# Patient Record
Sex: Female | Born: 1965 | Hispanic: No | Marital: Married | Smoking: Never smoker
Health system: Southern US, Community
[De-identification: ages and names within clinical notes are randomized; demographics above are authoritative.]

## PROBLEM LIST (undated history)

## (undated) DIAGNOSIS — K5792 Diverticulitis of intestine, part unspecified, without perforation or abscess without bleeding: Secondary | ICD-10-CM

## (undated) DIAGNOSIS — L659 Nonscarring hair loss, unspecified: Secondary | ICD-10-CM

## (undated) DIAGNOSIS — R232 Flushing: Secondary | ICD-10-CM

## (undated) HISTORY — DX: Nonscarring hair loss, unspecified: L65.9

## (undated) HISTORY — DX: Flushing: R23.2

## (undated) HISTORY — PX: ABDOMINAL HYSTERECTOMY: SHX81

## (undated) HISTORY — PX: HERNIA REPAIR: SHX51

---

## 1999-07-29 ENCOUNTER — Other Ambulatory Visit: Admission: RE | Admit: 1999-07-29 | Discharge: 1999-07-29 | Payer: Self-pay | Admitting: Obstetrics and Gynecology

## 2001-03-29 ENCOUNTER — Other Ambulatory Visit: Admission: RE | Admit: 2001-03-29 | Discharge: 2001-03-29 | Payer: Self-pay | Admitting: *Deleted

## 2001-04-06 ENCOUNTER — Encounter: Admission: RE | Admit: 2001-04-06 | Discharge: 2001-04-06 | Payer: Self-pay | Admitting: Internal Medicine

## 2001-04-06 ENCOUNTER — Encounter: Payer: Self-pay | Admitting: Internal Medicine

## 2002-05-02 ENCOUNTER — Other Ambulatory Visit: Admission: RE | Admit: 2002-05-02 | Discharge: 2002-05-02 | Payer: Self-pay | Admitting: Internal Medicine

## 2002-08-20 ENCOUNTER — Encounter: Payer: Self-pay | Admitting: Internal Medicine

## 2002-08-20 ENCOUNTER — Encounter: Admission: RE | Admit: 2002-08-20 | Discharge: 2002-08-20 | Payer: Self-pay | Admitting: Internal Medicine

## 2003-04-03 ENCOUNTER — Other Ambulatory Visit: Admission: RE | Admit: 2003-04-03 | Discharge: 2003-04-03 | Payer: Self-pay | Admitting: Internal Medicine

## 2013-02-03 DIAGNOSIS — T7840XA Allergy, unspecified, initial encounter: Secondary | ICD-10-CM | POA: Insufficient documentation

## 2013-09-07 ENCOUNTER — Ambulatory Visit (INDEPENDENT_AMBULATORY_CARE_PROVIDER_SITE_OTHER): Payer: Self-pay

## 2013-09-07 ENCOUNTER — Other Ambulatory Visit: Payer: Self-pay | Admitting: Family Medicine

## 2013-09-07 DIAGNOSIS — M79609 Pain in unspecified limb: Secondary | ICD-10-CM

## 2013-09-07 DIAGNOSIS — R52 Pain, unspecified: Secondary | ICD-10-CM

## 2014-10-09 DIAGNOSIS — N951 Menopausal and female climacteric states: Secondary | ICD-10-CM | POA: Insufficient documentation

## 2014-10-09 DIAGNOSIS — Z0000 Encounter for general adult medical examination without abnormal findings: Secondary | ICD-10-CM | POA: Insufficient documentation

## 2015-06-17 ENCOUNTER — Emergency Department (HOSPITAL_BASED_OUTPATIENT_CLINIC_OR_DEPARTMENT_OTHER)
Admission: EM | Admit: 2015-06-17 | Discharge: 2015-06-17 | Disposition: A | Discharge status: Home / Self Care | Payer: Commercial Managed Care - HMO | Attending: Emergency Medicine | Admitting: Emergency Medicine

## 2015-06-17 ENCOUNTER — Encounter (HOSPITAL_BASED_OUTPATIENT_CLINIC_OR_DEPARTMENT_OTHER): Payer: Self-pay

## 2015-06-17 DIAGNOSIS — K5732 Diverticulitis of large intestine without perforation or abscess without bleeding: Secondary | ICD-10-CM | POA: Diagnosis not present

## 2015-06-17 DIAGNOSIS — R1032 Left lower quadrant pain: Secondary | ICD-10-CM | POA: Diagnosis present

## 2015-06-17 MED ORDER — METRONIDAZOLE IN NACL 5-0.79 MG/ML-% IV SOLN
500.0000 mg | Freq: Once | INTRAVENOUS | Status: AC
  Administered 2015-06-17: 500 mg via INTRAVENOUS
  Filled 2015-06-17: qty 100

## 2015-06-17 MED ORDER — HYDROCODONE-ACETAMINOPHEN 5-325 MG PO TABS: 1.0000 | ORAL_TABLET | ORAL | Status: DC | PRN

## 2015-06-17 MED ORDER — SODIUM CHLORIDE 0.9 % IV BOLUS (SEPSIS)
1000.0000 mL | Freq: Once | INTRAVENOUS | Status: AC
  Administered 2015-06-17: 1000 mL via INTRAVENOUS

## 2015-06-17 MED ORDER — ONDANSETRON 4 MG PO TBDP: ORAL_TABLET | ORAL | Status: DC

## 2015-06-17 MED ORDER — CIPROFLOXACIN HCL 500 MG PO TABS: 500.0000 mg | ORAL_TABLET | Freq: Two times a day (BID) | ORAL | Status: DC

## 2015-06-17 MED ORDER — METRONIDAZOLE 500 MG PO TABS: 500.0000 mg | ORAL_TABLET | Freq: Two times a day (BID) | ORAL | Status: DC

## 2015-06-17 MED ORDER — MORPHINE SULFATE (PF) 4 MG/ML IV SOLN
4.0000 mg | Freq: Once | INTRAVENOUS | Status: AC
  Administered 2015-06-17: 4 mg via INTRAVENOUS
  Filled 2015-06-17: qty 1

## 2015-06-17 MED ORDER — HYDROMORPHONE HCL 1 MG/ML IJ SOLN
1.0000 mg | Freq: Once | INTRAMUSCULAR | Status: AC
  Administered 2015-06-17: 1 mg via INTRAVENOUS
  Filled 2015-06-17: qty 1

## 2015-06-17 MED ORDER — CIPROFLOXACIN IN D5W 400 MG/200ML IV SOLN
400.0000 mg | Freq: Once | INTRAVENOUS | Status: AC
  Administered 2015-06-17: 400 mg via INTRAVENOUS
  Filled 2015-06-17: qty 200

## 2015-06-17 NOTE — Discharge Instructions (Signed)
Take Vicodin for severe pain only. No driving or operating heavy machinery while taking vicodin. This medication may cause drowsiness. Take Zofran as directed as needed for nausea. Take both antibiotics to completion. Follow up with your primary care doctor.  Diverticulitis Diverticulitis is inflammation or infection of small pouches in your colon that form when you have a condition called diverticulosis. The pouches in your colon are called diverticula. Your colon, or large intestine, is where water is absorbed and stool is formed. Complications of diverticulitis can include:  Bleeding.  Severe infection.  Severe pain.  Perforation of your colon.  Obstruction of your colon. CAUSES  Diverticulitis is caused by bacteria. Diverticulitis happens when stool becomes trapped in diverticula. This allows bacteria to grow in the diverticula, which can lead to inflammation and infection. RISK FACTORS People with diverticulosis are at risk for diverticulitis. Eating a diet that does not include enough fiber from fruits and vegetables may make diverticulitis more likely to develop. SYMPTOMS  Symptoms of diverticulitis may include:  Abdominal pain and tenderness. The pain is normally located on the left side of the abdomen, but may occur in other areas.  Fever and chills.  Bloating.  Cramping.  Nausea.  Vomiting.  Constipation.  Diarrhea.  Blood in your stool. DIAGNOSIS  Your health care provider will ask you about your medical history and do a physical exam. You may need to have tests done because many medical conditions can cause the same symptoms as diverticulitis. Tests may include:  Blood tests.  Urine tests.  Imaging tests of the abdomen, including X-rays and CT scans. When your condition is under control, your health care provider may recommend that you have a colonoscopy. A colonoscopy can show how severe your diverticula are and whether something else is causing your  symptoms. TREATMENT  Most cases of diverticulitis are mild and can be treated at home. Treatment may include:  Taking over-the-counter pain medicines.  Following a clear liquid diet.  Taking antibiotic medicines by mouth for 7-10 days. More severe cases may be treated at a hospital. Treatment may include:  Not eating or drinking.  Taking prescription pain medicine.  Receiving antibiotic medicines through an IV tube.  Receiving fluids and nutrition through an IV tube.  Surgery. HOME CARE INSTRUCTIONS   Follow your health care provider's instructions carefully.  Follow a full liquid diet or other diet as directed by your health care provider. After your symptoms improve, your health care provider may tell you to change your diet. He or she may recommend you eat a high-fiber diet. Fruits and vegetables are good sources of fiber. Fiber makes it easier to pass stool.  Take fiber supplements or probiotics as directed by your health care provider.  Only take medicines as directed by your health care provider.  Keep all your follow-up appointments. SEEK MEDICAL CARE IF:   Your pain does not improve.  You have a hard time eating food.  Your bowel movements do not return to normal. SEEK IMMEDIATE MEDICAL CARE IF:   Your pain becomes worse.  Your symptoms do not get better.  Your symptoms suddenly get worse.  You have a fever.  You have repeated vomiting.  You have bloody or black, tarry stools. MAKE SURE YOU:   Understand these instructions.  Will watch your condition.  Will get help right away if you are not doing well or get worse.   This information is not intended to replace advice given to you by your health care  provider. Make sure you discuss any questions you have with your health care provider.   Document Released: 05/26/2005 Document Revised: 08/21/2013 Document Reviewed: 07/11/2013 Elsevier Interactive Patient Education [employer]

## 2015-06-17 NOTE — ED Provider Notes (Signed)
CSN: [id number]     Arrival date & time 06/17/15  1340 History   First MD Initiated Contact with Patient 06/17/15 1350     Chief Complaint  Patient presents with  . Abdominal Pain     (Consider location/radiation/quality/duration/timing/severity/associated sxs/prior Treatment) HPI Comments: 49 y/o healthy F presenting with known diverticulitis. She was seen at [facility] for left lower quadrant abdominal pain, had a CT that confirmed diverticulitis without abscess or perforation. She was advised to go to the emergency department for pain control and antibiotics. She was not given anything for pain I her to arrival. Pain began yesterday around 3 PM and has been gradually worsening. Had an episode of nonbloody diarrhea after drinking the oral contrast. No fever, chills, nausea or vomiting.  Patient is a 49 y.o. female presenting with abdominal pain. The history is provided by the patient and medical records.  Abdominal Pain Pain location:  LLQ Pain quality: aching and sharp   Pain radiates to:  Back Pain severity now: 8/10 with movement and palpation. Onset quality:  Gradual Duration:  2 days Timing:  Constant Progression:  Worsening Chronicity:  New Relieved by:  Heat Worsened by:  Movement and palpation Associated symptoms: diarrhea (1 episode after drinking contrast)   Associated symptoms: no chills, no fever, no nausea and no vomiting     History reviewed. No pertinent past medical history. Past Surgical History  Procedure Laterality Date  . Abdominal hysterectomy     No family history on file. Social History  Substance Use Topics  . Smoking status: Never Smoker   . Smokeless tobacco: None  . Alcohol Use: No   OB History    No data available     Review of Systems  Constitutional: Negative for fever and chills.  Gastrointestinal: Positive for abdominal pain and diarrhea (1 episode after drinking contrast). Negative for nausea and vomiting.  All other  systems reviewed and are negative.     Allergies  Review of patient's allergies indicates no known allergies.  Home Medications   Prior to Admission medications   Medication Sig Start Date End Date Taking? Authorizing Provider  ciprofloxacin (CIPRO) 500 MG tablet Take 1 tablet (500 mg total) by mouth 2 (two) times daily. One po bid x 10 days 06/17/15   Kathrynn Speed, PA-C  HYDROcodone-acetaminophen (NORCO/VICODIN) 5-325 MG tablet Take 1-2 tablets by mouth every 4 (four) hours as needed. 06/17/15   Tae Robak M Skylene Deremer, PA-C  metroNIDAZOLE (FLAGYL) 500 MG tablet Take 1 tablet (500 mg total) by mouth 2 (two) times daily. One po bid x 10 days 06/17/15   Kathrynn Speed, PA-C  ondansetron (ZOFRAN ODT) 4 MG disintegrating tablet  ODT q4 hours prn nausea/vomit 06/17/15   Dann Galicia M Rafferty Postlewait, PA-C   BP 131/84 mmHg  Pulse 86  Temp(Src) 98.6 F (37 C) (Oral)  Resp 20  Ht  (1.575 m)  Wt 146 lb (66.225 kg)  BMI 26.70 kg/m2  SpO2 99% Physical Exam  Constitutional: She is oriented to person, place, and time. She appears well-developed and well-nourished. No distress.  HENT:  Head: Normocephalic and atraumatic.  Mouth/Throat: Oropharynx is clear and moist.  Eyes: Conjunctivae and EOM are normal. Pupils are equal, round, and reactive to light. No scleral icterus.  Neck: Normal range of motion. Neck supple.  Cardiovascular: Normal rate, regular rhythm and normal heart sounds.   Pulmonary/Chest: Effort normal and breath sounds normal. No respiratory distress.  Abdominal: Soft. Normal appearance and bowel sounds  are normal. There is tenderness in the left lower quadrant. There is guarding. There is no rigidity and no rebound.  No peritoneal signs.  Musculoskeletal: Normal range of motion. She exhibits no edema.  Neurological: She is alert and oriented to person, place, and time. No sensory deficit.  Skin: Skin is warm and dry.  Psychiatric: She has a normal mood and affect. Her behavior is normal.   Nursing note and vitals reviewed.   ED Course  Procedures (including critical care time) Labs Review Labs Reviewed - No data to display  Imaging Review No results found. I have personally reviewed and evaluated these images and lab results as part of my medical decision-making.   EKG Interpretation None      [facility]. WBC 9.7, hgb 12.3. Electrolytes WNL. CT [facility] abnormal significant thickening of colonic wall in distal left colon left abdomen. There is pericolonic stranding and small amount of pericolonic fluid. Findings consistent with significant segmental colitis or acute diverticulitis. No pericolonic abscess or definite evidence of perforation.  MDM   Final diagnoses:  Diverticulitis of large intestine without perforation or abscess without bleeding   Non-toxic/non-septic appearing, NAD. AFVSS. Pain improved in ED with morphine and dilaudid. Resting comfortably. Repeat exam without guarding. Given flagyl/cipro IV. Pt feeling well enough to go home. Will d/c home with cipro/flagyl, zofran and vicodin. Advised PCP f/u within 2 days. Infection care/precautions discussed. Stable for d/c. Return precautions given. Pt/family/caregiver aware medical decision making process and agreeable with plan.  Discussed with Dr. Anitra LauthPlunkett, agrees with plan.   Kathrynn SpeedRobyn M Nya Monds, PA-C 06/17/15 1624  Gwyneth SproutWhitney Plunkett, MD 06/18/15 1530

## 2015-06-17 NOTE — ED Notes (Addendum)
Left side abd pain-pt from outpt CT-MD called with results and advised pt to come to ED

## 2015-06-30 DIAGNOSIS — K5732 Diverticulitis of large intestine without perforation or abscess without bleeding: Secondary | ICD-10-CM | POA: Insufficient documentation

## 2016-01-21 DIAGNOSIS — G4700 Insomnia, unspecified: Secondary | ICD-10-CM | POA: Insufficient documentation

## 2016-05-27 ENCOUNTER — Ambulatory Visit: Payer: Commercial Managed Care - HMO | Admitting: Medical

## 2016-05-31 ENCOUNTER — Ambulatory Visit: Payer: Commercial Managed Care - HMO | Admitting: Family Medicine

## 2017-02-01 DIAGNOSIS — G43009 Migraine without aura, not intractable, without status migrainosus: Secondary | ICD-10-CM | POA: Insufficient documentation

## 2019-03-31 ENCOUNTER — Emergency Department (HOSPITAL_BASED_OUTPATIENT_CLINIC_OR_DEPARTMENT_OTHER): Payer: 59

## 2019-03-31 ENCOUNTER — Other Ambulatory Visit: Payer: Self-pay

## 2019-03-31 ENCOUNTER — Encounter (HOSPITAL_BASED_OUTPATIENT_CLINIC_OR_DEPARTMENT_OTHER): Payer: Self-pay | Admitting: Emergency Medicine

## 2019-03-31 ENCOUNTER — Emergency Department (HOSPITAL_BASED_OUTPATIENT_CLINIC_OR_DEPARTMENT_OTHER)
Admission: EM | Admit: 2019-03-31 | Discharge: 2019-03-31 | Disposition: A | Discharge status: Home / Self Care | Payer: 59 | Attending: Emergency Medicine | Admitting: Emergency Medicine

## 2019-03-31 DIAGNOSIS — R109 Unspecified abdominal pain: Secondary | ICD-10-CM | POA: Diagnosis present

## 2019-03-31 DIAGNOSIS — K529 Noninfective gastroenteritis and colitis, unspecified: Secondary | ICD-10-CM | POA: Insufficient documentation

## 2019-03-31 HISTORY — DX: Diverticulitis of intestine, part unspecified, without perforation or abscess without bleeding: K57.92

## 2019-03-31 LAB — COMPREHENSIVE METABOLIC PANEL
ALT: 21 U/L (ref 0–44)
AST: 25 U/L (ref 15–41)
Albumin: 3.7 g/dL (ref 3.5–5.0)
Alkaline Phosphatase: 86 U/L (ref 38–126)
Anion gap: 10 (ref 5–15)
BUN: 11 mg/dL (ref 6–20)
CO2: 26 mmol/L (ref 22–32)
Calcium: 8.8 mg/dL — ABNORMAL LOW (ref 8.9–10.3)
Chloride: 97 mmol/L — ABNORMAL LOW (ref 98–111)
Creatinine, Ser: 1.04 mg/dL — ABNORMAL HIGH (ref 0.44–1.00)
GFR calc Af Amer: >60 mL/min (ref >60)
GFR calc non Af Amer: >60 mL/min (ref >60)
Glucose, Bld: 107 mg/dL — ABNORMAL HIGH (ref 70–99)
Potassium: 4.1 mmol/L (ref 3.5–5.1)
Sodium: 133 mmol/L — ABNORMAL LOW (ref 135–145)
Total Bilirubin: 0.8 mg/dL (ref 0.3–1.2)
Total Protein: 7.4 g/dL (ref 6.5–8.1)

## 2019-03-31 LAB — URINALYSIS, ROUTINE W REFLEX MICROSCOPIC
Bilirubin Urine: NEGATIVE
Glucose, UA: NEGATIVE mg/dL
Ketones, ur: NEGATIVE mg/dL
Leukocytes,Ua: NEGATIVE
Nitrite: NEGATIVE
Protein, ur: NEGATIVE mg/dL
Specific Gravity, Urine: 1.02 (ref 1.005–1.030)
pH: 6 (ref 5.0–8.0)

## 2019-03-31 LAB — CBC WITH DIFFERENTIAL/PLATELET
Abs Immature Granulocytes: 0.02 10*3/uL (ref 0.00–0.07)
Basophils Absolute: 0 10*3/uL (ref 0.0–0.1)
Basophils Relative: 0 %
Eosinophils Absolute: 0 10*3/uL (ref 0.0–0.5)
Eosinophils Relative: 1 %
HCT: 39 % (ref 36.0–46.0)
Hemoglobin: 12.5 g/dL (ref 12.0–15.0)
Immature Granulocytes: 0 %
Lymphocytes Relative: 17 %
Lymphs Abs: 1.4 10*3/uL (ref 0.7–4.0)
MCH: 30.1 pg (ref 26.0–34.0)
MCHC: 32.1 g/dL (ref 30.0–36.0)
MCV: 94 fL (ref 80.0–100.0)
Monocytes Absolute: 0.8 10*3/uL (ref 0.1–1.0)
Monocytes Relative: 10 %
Neutro Abs: 5.7 10*3/uL (ref 1.7–7.7)
Neutrophils Relative %: 72 %
Platelets: 282 10*3/uL (ref 150–400)
RBC: 4.15 MIL/uL (ref 3.87–5.11)
RDW: 13.2 % (ref 11.5–15.5)
WBC: 8 10*3/uL (ref 4.0–10.5)
nRBC: 0 % (ref 0.0–0.2)

## 2019-03-31 LAB — URINALYSIS, MICROSCOPIC (REFLEX)

## 2019-03-31 LAB — LIPASE, BLOOD: Lipase: 24 U/L (ref 11–51)

## 2019-03-31 MED ORDER — METRONIDAZOLE 500 MG PO TABS
500.0000 mg | ORAL_TABLET | Freq: Once | ORAL | Status: AC
  Administered 2019-03-31: 500 mg via ORAL
  Filled 2019-03-31: qty 1

## 2019-03-31 MED ORDER — HYDROMORPHONE HCL 1 MG/ML IJ SOLN
1.0000 mg | Freq: Once | INTRAMUSCULAR | Status: AC
  Administered 2019-03-31: 1 mg via INTRAVENOUS
  Filled 2019-03-31: qty 1

## 2019-03-31 MED ORDER — METRONIDAZOLE IN NACL 5-0.79 MG/ML-% IV SOLN: 500.0000 mg | Freq: Once | INTRAVENOUS | Status: DC

## 2019-03-31 MED ORDER — HYDROCODONE-ACETAMINOPHEN 5-325 MG PO TABS: 1.0000 | ORAL_TABLET | ORAL | 0 refills | Status: DC | PRN

## 2019-03-31 MED ORDER — SODIUM CHLORIDE 0.9 % IV BOLUS
1000.0000 mL | Freq: Once | INTRAVENOUS | Status: AC
  Administered 2019-03-31: 1000 mL via INTRAVENOUS

## 2019-03-31 MED ORDER — MORPHINE SULFATE (PF) 4 MG/ML IV SOLN
4.0000 mg | Freq: Once | INTRAVENOUS | Status: AC
  Administered 2019-03-31: 4 mg via INTRAVENOUS
  Filled 2019-03-31: qty 1

## 2019-03-31 MED ORDER — IOHEXOL 300 MG/ML  SOLN
100.0000 mL | Freq: Once | INTRAMUSCULAR | Status: AC | PRN
  Administered 2019-03-31: 100 mL via INTRAVENOUS

## 2019-03-31 MED ORDER — CIPROFLOXACIN HCL 500 MG PO TABS: 500.0000 mg | ORAL_TABLET | Freq: Two times a day (BID) | ORAL | 0 refills | Status: DC

## 2019-03-31 MED ORDER — METRONIDAZOLE 500 MG PO TABS: 500.0000 mg | ORAL_TABLET | Freq: Three times a day (TID) | ORAL | 0 refills | Status: DC

## 2019-03-31 MED ORDER — CIPROFLOXACIN HCL 500 MG PO TABS
500.0000 mg | ORAL_TABLET | Freq: Once | ORAL | Status: AC
  Administered 2019-03-31: 500 mg via ORAL
  Filled 2019-03-31: qty 1

## 2019-03-31 MED ORDER — ONDANSETRON HCL 4 MG/2ML IJ SOLN
4.0000 mg | Freq: Once | INTRAMUSCULAR | Status: AC
  Administered 2019-03-31: 4 mg via INTRAVENOUS
  Filled 2019-03-31: qty 2

## 2019-03-31 MED ORDER — ONDANSETRON 4 MG PO TBDP: 4.0000 mg | ORAL_TABLET | Freq: Three times a day (TID) | ORAL | 0 refills | Status: DC | PRN

## 2019-03-31 NOTE — ED Notes (Signed)
ED Provider at bedside. 

## 2019-03-31 NOTE — ED Notes (Signed)
unsuccesful IV right AC.

## 2019-03-31 NOTE — ED Provider Notes (Signed)
[facility] EMERGENCY DEPARTMENT Provider Note   CSN: [id number] Arrival date & time: 03/31/19  1521     History   Chief Complaint Chief Complaint  Patient presents with  . Abdominal Pain    HPI Caitlyn ShuttersLeslie Maisano is a 53 y.o. female who presents with abdominal pain.  Past medical history significant for diverticulitis.  Patient states that yesterday she started to develop lower abdominal pain.  It is over the left lower quadrant and radiates to the right lower quadrant.  Pain feels like an intense pressure.  It is worse when she moves around and she cannot get comfortable.  She has not had an appetite.  Sometimes feels better when she tries to go to the bathroom however she has not been able to have a good bowel movement. She has had a small amount of non-bloody diarrhea. She denies fever, chills, nausea, vomiting.  She denies any urinary symptoms.  Past surgical history significant for hysterectomy and a hernia repair.  She reportedly had a colonoscopy in 2016 which was normal.     HPI  Past Medical History:  Diagnosis Date  . Diverticulitis     There are no active problems to display for this patient.   Past Surgical History:  Procedure Laterality Date  . ABDOMINAL HYSTERECTOMY    . HERNIA REPAIR       OB History   No obstetric history on file.      Home Medications    Prior to Admission medications   Medication Sig Start Date End Date Taking? Authorizing Provider  ciprofloxacin (CIPRO) 500 MG tablet Take 1 tablet (500 mg total) by mouth 2 (two) times daily. One po bid x 10 days 06/17/15   Hess, Nada Boozerobyn M, PA-C  HYDROcodone-acetaminophen (NORCO/VICODIN) 5-325 MG tablet Take 1-2 tablets by mouth every 4 (four) hours as needed. 06/17/15   Hess, Nada Boozerobyn M, PA-C  metroNIDAZOLE (FLAGYL) 500 MG tablet Take 1 tablet (500 mg total) by mouth 2 (two) times daily. One po bid x 10 days 06/17/15   Paulina FusiHess, Melina Schoolsobyn M, PA-C  ondansetron (ZOFRAN ODT) 4 MG disintegrating tablet 4mg  ODT  q4 hours prn nausea/vomit 06/17/15   Hess, Nada Boozerobyn M, PA-C    Family History No family history on file.  Social History Social History   Tobacco Use  . Smoking status: Never Smoker  . Smokeless tobacco: Never Used  Substance Use Topics  . Alcohol use: No  . Drug use: No     Allergies   Patient has no known allergies.   Review of Systems Review of Systems  Constitutional: Negative for chills and fever.  Respiratory: Negative for shortness of breath.   Cardiovascular: Negative for chest pain.  Gastrointestinal: Positive for abdominal pain and diarrhea. Negative for blood in stool, nausea and vomiting.  Genitourinary: Negative for difficulty urinating and dysuria.  All other systems reviewed and are negative.    Physical Exam Updated Vital Signs BP (!) 142/115 (BP Location: Left Arm)   Pulse 100   Temp 99.7 F (37.6 C) (Oral)   Resp 18   Ht 5\' 2"  (1.575 m)   Wt 65.8 kg   SpO2 98%   BMI 26.52 kg/m   Physical Exam Vitals signs and nursing note reviewed.  Constitutional:      General: She is not in acute distress.    Appearance: She is well-developed. She is not ill-appearing.     Comments: Calm, cooperative. Mildly uncomfortable appearing  HENT:     Head: Normocephalic  and atraumatic.  Eyes:     General: No scleral icterus.       Right eye: No discharge.        Left eye: No discharge.     Conjunctiva/sclera: Conjunctivae normal.     Pupils: Pupils are equal, round, and reactive to light.  Neck:     Musculoskeletal: Normal range of motion.  Cardiovascular:     Rate and Rhythm: Normal rate.  Pulmonary:     Effort: Pulmonary effort is normal. No respiratory distress.  Abdominal:     General: Abdomen is flat. Bowel sounds are normal. There is no distension.     Palpations: Abdomen is soft.     Tenderness: There is abdominal tenderness (worse in LLQ) in the right lower quadrant, suprapubic area and left upper quadrant.  Skin:    General: Skin is warm and  dry.  Neurological:     Mental Status: She is alert and oriented to person, place, and time.  Psychiatric:        Behavior: Behavior normal.      ED Treatments / Results  Labs (all labs ordered are listed, but only abnormal results are displayed) Labs Reviewed  COMPREHENSIVE METABOLIC PANEL - Abnormal; Notable for the following components:      Result Value   Sodium 133 (*)    Chloride 97 (*)    Glucose, Bld 107 (*)    Creatinine, Ser 1.04 (*)    Calcium 8.8 (*)    All other components within normal limits  URINALYSIS, ROUTINE W REFLEX MICROSCOPIC - Abnormal; Notable for the following components:   Hgb urine dipstick SMALL (*)    All other components within normal limits  URINALYSIS, MICROSCOPIC (REFLEX) - Abnormal; Notable for the following components:   Bacteria, UA FEW (*)    All other components within normal limits  LIPASE, BLOOD  CBC WITH DIFFERENTIAL/PLATELET    EKG None  Radiology Ct Abdomen Pelvis W Contrast  Result Date: 03/31/2019 CLINICAL DATA:  Abdominal pain and diarrhea EXAM: CT ABDOMEN AND PELVIS WITH CONTRAST TECHNIQUE: Multidetector CT imaging of the abdomen and pelvis was performed using the standard protocol following bolus administration of intravenous contrast. CONTRAST:  100mL OMNIPAQUE IOHEXOL 300 MG/ML  SOLN COMPARISON:  June 17, 2015. FINDINGS: Lower chest: On axial slice 1 series 3, there is a stable 3 mm nodular opacity in the lateral segment right middle lobe. Lung bases otherwise are clear. Hepatobiliary: No focal liver lesions are evident. There is a Riedel's lobe on the right, an anatomic variant. Gallbladder wall is not appreciably thickened. There is no biliary duct dilatation. Pancreas: There is no appreciable pancreatic mass or inflammatory focus. Spleen: No splenic lesions are evident. Adrenals/Urinary Tract: Adrenals bilaterally appear unremarkable. There is a 1 x 1 cm cyst in the upper pole left kidney. No hydronephrosis is evident on  either side. No renal or ureteral calculi are demonstrable. Contrast is seen within the collecting systems and ureters, a finding which could obscure small calculi. Urinary bladder is midline with wall thickness within normal limits. Stomach/Bowel: There is a loop of proximal sigmoid colon in the left mid pelvis which shows generalized wall thickening. This loop of bowel with wall thickening extends over approximately 6 cm. There is apparent luminal narrowing in this area. There is no surrounding mesenteric thickening. No diverticular irregularity seen in this area. No similar bowel wall thickening is seen elsewhere in the abdomen or pelvis. No evident bowel obstruction. No free air or portal venous  air. The terminal ileum appears unremarkable. Vascular/Lymphatic: There is no abdominal aortic aneurysm. No vascular lesions are evident. There is no adenopathy in the abdomen or pelvis. Reproductive: Uterus is absent. No adnexal masses are evident. There is moderate fluid in the dependent portion of the pelvis slightly to the right of midline. Other: Appendix appears normal. No abscess is evident in the abdomen or pelvis. Musculoskeletal: There are foci of degenerative change in the lumbar spine. There are no blastic or lytic bone lesions. There is no intramuscular or abdominal wall lesion. IMPRESSION: 1. There is an approximately 6 cm long focus of concentric narrowing of the proximal sigmoid colon. There is no appreciable diverticular disease in this area. This area may represent localized colitis. A concentric mass arising in this portion of the colon could present in this manner. This finding warrants direct visualization to assess for possible neoplastic lesion in the sigmoid colon. No other bowel wall thickening. No diverticulitis appreciable. No bowel obstruction. 2. Moderate free fluid is noted in the dependent portion of the pelvis. Question recent ovarian cyst rupture. 3.  No abscess in the abdomen or pelvis.   Appendix appears normal. 4. No evident renal or ureteral calculus. No hydronephrosis. Note that contrast is seen in the collecting systems and ureters which could obscure small calculi. Due to technical issue, earlier images were not able to be obtained showing renal parenchymal and ureters without contrast present. 5.  3 mm nodular opacity right middle lobe, stable. 6.  Uterus absent. Electronically Signed   By: Lowella Grip III M.D.   On: 03/31/2019 18:14    Procedures Procedures (including critical care time)  Medications Ordered in ED Medications  sodium chloride 0.9 % bolus 1,000 mL (0 mLs Intravenous Stopped 03/31/19 2026)  morphine 4 MG/ML injection 4 mg (4 mg Intravenous Given 03/31/19 1648)  iohexol (OMNIPAQUE) 300 MG/ML solution 100 mL (100 mLs Intravenous Contrast Given 03/31/19 1733)  HYDROmorphone (DILAUDID) injection 1 mg (1 mg Intravenous Given 03/31/19 1851)  ciprofloxacin (CIPRO) tablet 500 mg (500 mg Oral Given 03/31/19 1850)  metroNIDAZOLE (FLAGYL) tablet 500 mg (500 mg Oral Given 03/31/19 1850)  ondansetron (ZOFRAN) injection 4 mg (4 mg Intravenous Given 03/31/19 2023)     Initial Impression / Assessment and Plan / ED Course  I have reviewed the triage vital signs and the nursing notes.  Pertinent labs & imaging results that were available during my care of the patient were reviewed by me and considered in my medical decision making (see chart for details).  53 year old female presents with left lower quadrant pain for 1 day.  She has a history of diverticulitis.  She is initially hypertensive and mildly tachycardic.  Repeat vital signs are improved.  Lungs are clear to auscultation.  Abdomen is soft and tender in the left lower quadrant and suprapubic area.  Will obtain labs, UA, CT abdomen pelvis.  Will give pain medicine and fluids.  CBC is normal.  CMP is remarkable for mild hyponatremia and hypochloremia.  Calcium is slightly low as well.  Liver function is normal.  UA is  unremarkable.  CT shows focal colitis in the proximal sigmoid colon versus a possible mass.  Most likely symptoms are related to colitis due to acute onset.  Will treat with Cipro and Flagyl.  Recheck the patient she is still uncomfortable due to pain.  She was given additional dose of pain medicine and antibiotics here.  Informed by nursing that the patient is started to vomit.  I  rechecked the patient and she states that she has not had anything to eat all day and that in addition to the pain medicines made her throw up.  She feels better now.  We will give her a prescription for pain medicine, antibiotics, nausea medicine.  She was also given a referral to GI as she will likely need follow-up regarding CT findings.  Final Clinical Impressions(s) / ED Diagnoses   Final diagnoses:  Colitis    ED Discharge Orders    None       Bethel BornGekas, Abbigaile Rockman Marie, PA-C 03/31/19 2141    Pricilla LovelessGoldston, Scott, MD 04/01/19 2249

## 2019-03-31 NOTE — Discharge Instructions (Signed)
Take Cipro 500mg  twice a day for one week Take Flagyl 500mg  three times a day for one week Take Norco as needed for pain Take zofran as needed for nausea Please follow up with a GI doctor Return if worsening

## 2019-03-31 NOTE — ED Triage Notes (Signed)
LLQ pain since yesterday. Hx of diverticulitis.

## 2019-08-22 DIAGNOSIS — E7800 Pure hypercholesterolemia, unspecified: Secondary | ICD-10-CM | POA: Insufficient documentation

## 2019-08-22 DIAGNOSIS — D72819 Decreased white blood cell count, unspecified: Secondary | ICD-10-CM | POA: Insufficient documentation

## 2020-03-12 ENCOUNTER — Emergency Department (HOSPITAL_BASED_OUTPATIENT_CLINIC_OR_DEPARTMENT_OTHER)
Admission: EM | Admit: 2020-03-12 | Discharge: 2020-03-12 | Disposition: A | Discharge status: Home / Self Care | Payer: 59 | Attending: Emergency Medicine | Admitting: Emergency Medicine

## 2020-03-12 ENCOUNTER — Other Ambulatory Visit: Payer: Self-pay

## 2020-03-12 ENCOUNTER — Encounter (HOSPITAL_BASED_OUTPATIENT_CLINIC_OR_DEPARTMENT_OTHER): Payer: Self-pay

## 2020-03-12 DIAGNOSIS — J40 Bronchitis, not specified as acute or chronic: Secondary | ICD-10-CM | POA: Insufficient documentation

## 2020-03-12 DIAGNOSIS — R05 Cough: Secondary | ICD-10-CM | POA: Diagnosis present

## 2020-03-12 MED ORDER — AEROCHAMBER PLUS FLO-VU MEDIUM MISC
1.0000 | Freq: Once | Status: AC
  Administered 2020-03-12: 1
  Filled 2020-03-12: qty 1

## 2020-03-12 MED ORDER — BENZONATATE 100 MG PO CAPS: 100.0000 mg | ORAL_CAPSULE | Freq: Three times a day (TID) | ORAL | 0 refills | Status: DC

## 2020-03-12 MED ORDER — PREDNISONE 50 MG PO TABS: 50.0000 mg | ORAL_TABLET | Freq: Every day | ORAL | 0 refills | Status: AC

## 2020-03-12 MED ORDER — DICLOFENAC SODIUM 1 % EX GEL: 2.0000 g | Freq: Four times a day (QID) | CUTANEOUS | 0 refills | Status: DC

## 2020-03-12 MED ORDER — ALBUTEROL SULFATE HFA 108 (90 BASE) MCG/ACT IN AERS
2.0000 | INHALATION_SPRAY | Freq: Once | RESPIRATORY_TRACT | Status: AC
  Administered 2020-03-12: 2 via RESPIRATORY_TRACT
  Filled 2020-03-12: qty 6.7

## 2020-03-12 NOTE — Discharge Instructions (Signed)
Use your inhaler every 4 hours for the next 2 days.  After this, use as needed for shortness of breath, chest tightness, or coughing fits. Take the prednisone as prescribed.  Take the entire course. Use the voltaren gel on your chest to help with discomfort Use the Tessalon Perles to help decrease cough. You may use cough drops in between as needed for further coughing spells. You may try the cough syrup and/or honey to coat your throat and help with throat irritation. Over the primary care doctor as needed for symptoms not proving. Return to the emergency room with any new, worsening, concerning symptoms.

## 2020-03-12 NOTE — ED Triage Notes (Signed)
Pt c/o flu like sx started 7/5-seen at [facility] and "respiratory infection"-completed abx and prednisone-NAD-steady gait

## 2020-03-12 NOTE — ED Provider Notes (Signed)
[facility] EMERGENCY DEPARTMENT Provider Note   CSN: [id number] Arrival date & time: 03/12/20  1240     History Chief Complaint  Patient presents with   Cough    Caitlyn Sosa is a 54 y.o. female presenting for evaluation of cough, sore throat, chest tightness.  Patient states her symptoms began 10 days ago.  She was seen at urgent care, prescribed azithromycin and prednisone taper.  She states her symptoms were improving until yesterday, and they worsened again.  She finished her prednisone 2 days ago.  She states she tried exercise yesterday, but felt extremely short of breath and with lots of chest tightness.  Since then, she has been coughing a lot.  Is a nonproductive cough.  Denies fevers or chills.  She denies nasal congestion, ear pain, nausea, vomiting abdominal pain.  She reports mild throat irritation.  She denies history of asthma or COPD.  She has never needed an inhaler before.  She has received both Covid vaccines.  She denies sick contacts, or known contact with COVID-19 positive person.  She reports no other medical problems, takes no medications daily.  HPI     Past Medical History:  Diagnosis Date   Diverticulitis     There are no problems to display for this patient.   Past Surgical History:  Procedure Laterality Date   ABDOMINAL HYSTERECTOMY     HERNIA REPAIR       OB History   No obstetric history on file.     No family history on file.  Social History   Tobacco Use   Smoking status: Never Smoker   Smokeless tobacco: Never Used  Vaping Use   Vaping Use: Never used  Substance Use Topics   Alcohol use: No   Drug use: No    Home Medications Prior to Admission medications   Medication Sig Start Date End Date Taking? Authorizing Provider  benzonatate (TESSALON) 100 MG capsule Take 1 capsule (100 mg total) by mouth every 8 (eight) hours. 03/12/20   Mikiya Nebergall, PA-C  diclofenac Sodium (VOLTAREN) 1 % GEL Apply 2 g  topically 4 (four) times daily. 03/12/20   Dahir Ayer, PA-C  metroNIDAZOLE (FLAGYL) 500 MG tablet Take 1 tablet (500 mg total) by mouth 3 (three) times daily. 03/31/19   Bethel Born, PA-C  ondansetron (ZOFRAN ODT) 4 MG disintegrating tablet Take 1 tablet (4 mg total) by mouth every 8 (eight) hours as needed for nausea or vomiting. 03/31/19   Bethel Born, PA-C  predniSONE (DELTASONE) 50 MG tablet Take 1 tablet (50 mg total) by mouth daily for 5 days. 03/12/20 03/17/20  Whitfield Dulay, PA-C    Allergies    Patient has no known allergies.  Review of Systems   Review of Systems  HENT: Positive for sore throat.   Respiratory: Positive for cough and chest tightness.     Physical Exam Updated Vital Signs BP 133/90 (BP Location: Right Arm)    Pulse 80    Temp 97.9 F (36.6 C) (Oral)    Resp 16    Ht 5\' 2"  (1.575 m)    Wt 66.9 kg    SpO2 100%    BMI 26.98 kg/m   Physical Exam Vitals and nursing note reviewed.  Constitutional:      General: She is not in acute distress.    Appearance: She is well-developed.     Comments: Sitting in the chair no acute distress  HENT:     Head: Normocephalic  and atraumatic.     Comments: OP clear without tonsillar swelling exudate.  Uvula midline with equal palate rise.  TMs nonerythematous nonbulging bilaterally.  No significant nasal mucosal edema or rhinorrhea. Cardiovascular:     Rate and Rhythm: Normal rate and regular rhythm.     Pulses: Normal pulses.  Pulmonary:     Effort: Pulmonary effort is normal.     Breath sounds: Wheezing present.     Comments: Scattered expiratory wheeze heard on my exam.  Dry barky cough noted on exam.  Speaking full sentences.  Sats stable on room air.  No signs of accessory muscle use or respiratory distress. Abdominal:     General: There is no distension.     Palpations: There is no mass.     Tenderness: There is no abdominal tenderness. There is no guarding or rebound.  Musculoskeletal:         General: Normal range of motion.     Cervical back: Normal range of motion.  Skin:    General: Skin is warm.     Capillary Refill: Capillary refill takes less than 2 seconds.     Findings: No rash.  Neurological:     Mental Status: She is alert and oriented to person, place, and time.     ED Results / Procedures / Treatments   Labs (all labs ordered are listed, but only abnormal results are displayed) Labs Reviewed - No data to display  EKG None  Radiology No results found.  Procedures Procedures (including critical care time)  Medications Ordered in ED Medications  albuterol (VENTOLIN HFA) 108 (90 Base) MCG/ACT inhaler 2 puff (2 puffs Inhalation Given 03/12/20 1444)  AeroChamber Plus Flo-Vu Medium MISC 1 each (1 each Other Given 03/12/20 1447)    ED Course  I have reviewed the triage vital signs and the nursing notes.  Pertinent labs & imaging results that were available during my care of the patient were reviewed by me and considered in my medical decision making (see chart for details).    MDM Rules/Calculators/A&P                          Patient presenting for evaluation of continued cough, chest tightness, and sore throat.  This is been going on for approximately 10 days.  On my exam, she has some mild wheezing, as well as a barky cough.  Concern for bronchitis.  She has already been treated with antibiotics, low suspicion for pneumonia without fever or productive cough.  I do not believe chest x-ray would be beneficial, patient is agreeable.  Will give albuterol and reassess.  On reassessment, patient reports mild improvement of symptoms.  She is no longer coughing as much.  Wheezing has resolved on repeat exam.  Discussed treatment for bronchitis and typical course of symptoms lasting several weeks.  Discussed viral etiology, and that repeat antibiotic would not be beneficial.  Offered repeat prednisone burst treatment with NSAIDs, patient elects for steroids.   Discussed follow-up with PCP symptoms not improving.  At this time, patient appears safe for discharge.  Return precautions given.  Patient states she understands and agrees to plan.  Final Clinical Impression(s) / ED Diagnoses Final diagnoses:  Bronchitis    Rx / DC Orders ED Discharge Orders         Ordered    predniSONE (DELTASONE) 50 MG tablet  Daily     Discontinue  Reprint     03/12/20 1509  benzonatate (TESSALON) 100 MG capsule  Every 8 hours     Discontinue  Reprint     03/12/20 1509    diclofenac Sodium (VOLTAREN) 1 % GEL  4 times daily     Discontinue  Reprint     03/12/20 1509           Alveria Apley, PA-C 03/12/20 1645    Tegeler, Canary Brim, MD 03/14/20 1302

## 2020-04-23 IMAGING — CT CT ABDOMEN AND PELVIS WITH CONTRAST
2 of 5 series · 14 of 46 positions shown, 16 images · IV contrast (APPLIED)
Comparison: June 17, 2015.

CLINICAL DATA: Abdominal pain and diarrhea

EXAM:
CT ABDOMEN AND PELVIS WITH CONTRAST
TECHNIQUE: Multidetector CT imaging of the abdomen and pelvis was performed
using the standard protocol following bolus administration of
intravenous contrast.
CONTRAST:  100mL OMNIPAQUE IOHEXOL 300 MG/ML  SOLN

[Series 2: axial st · axial · 0.72mm/px · z∈[+478,+858]mm · 11 of 86 slices shown, 13 images]
[im 5/86  soft-tissue]
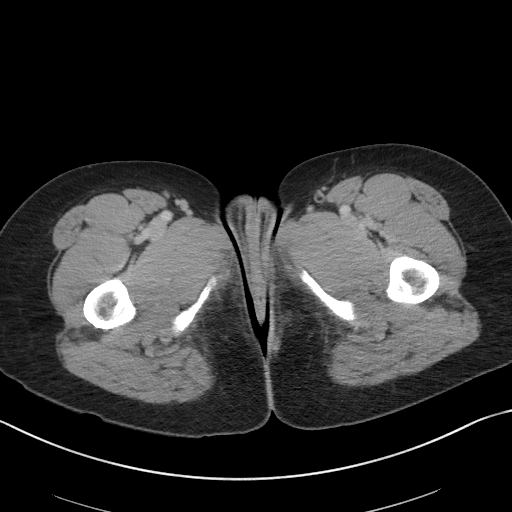
[im 5/86  bone]
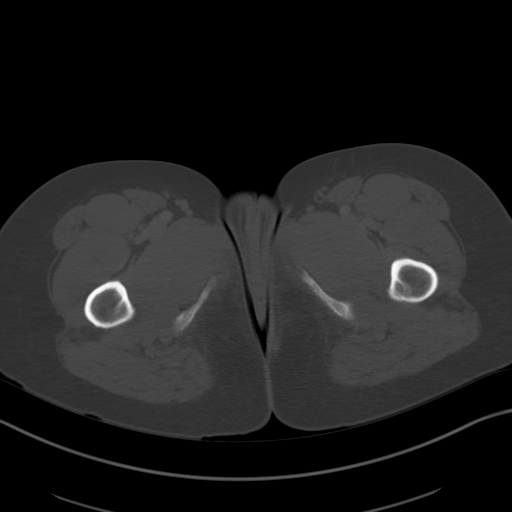
[im 15/86  soft-tissue]
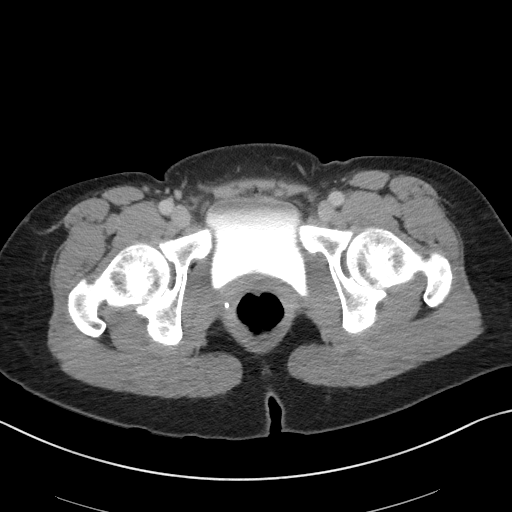
[im 19/86  soft-tissue]
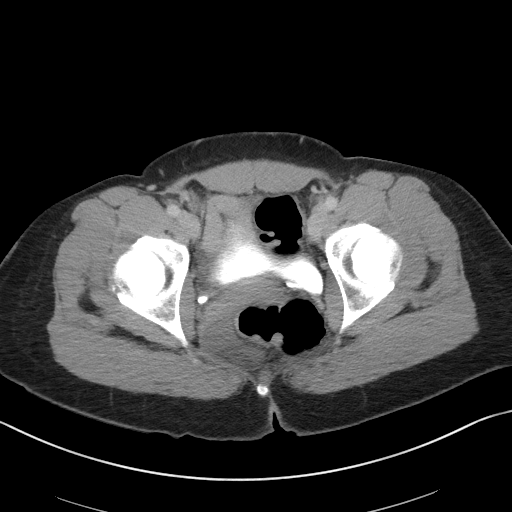
[im 29/86  soft-tissue]
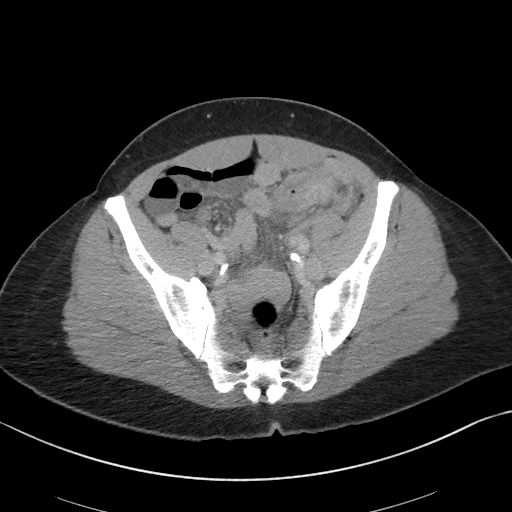
[im 34/86  soft-tissue]
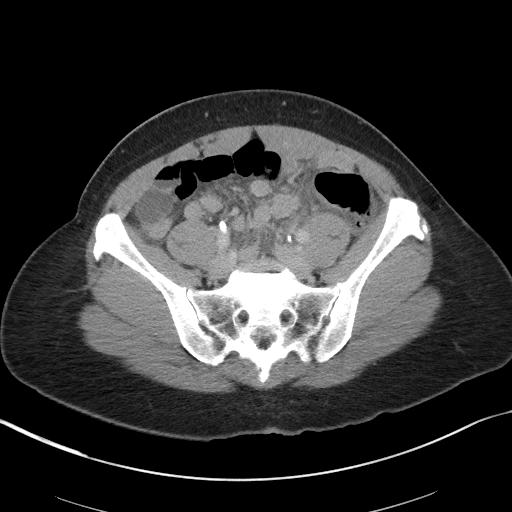
[im 43/86  soft-tissue]
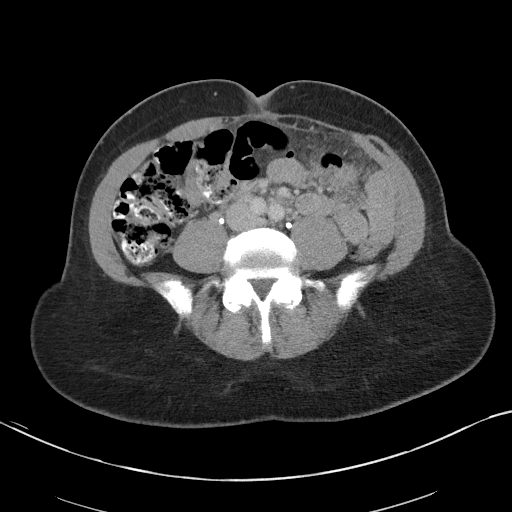
[im 52/86  soft-tissue]
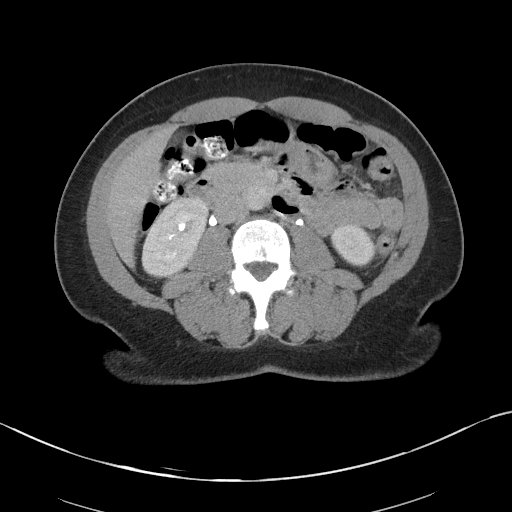
[im 57/86  soft-tissue]
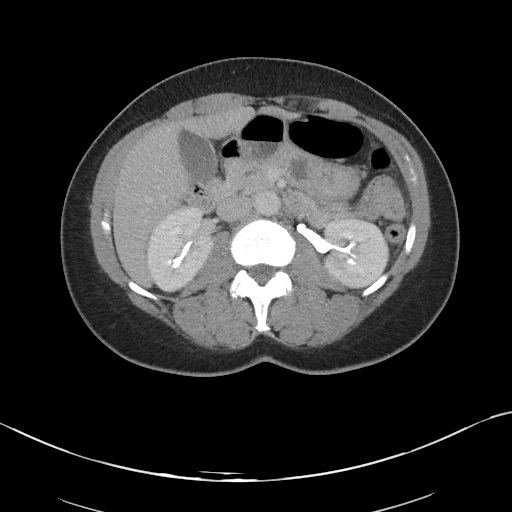
[im 67/86  soft-tissue]
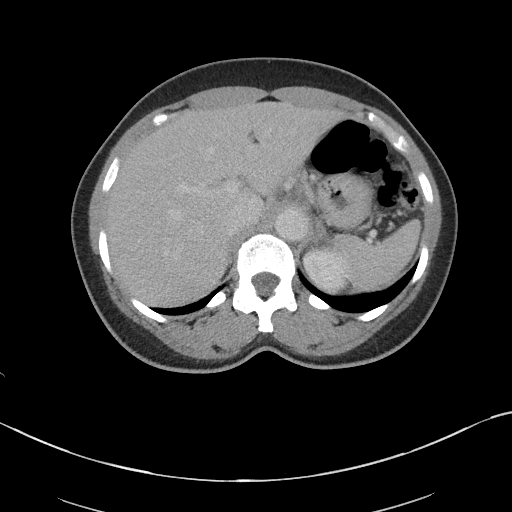
[im 67/86  bone]
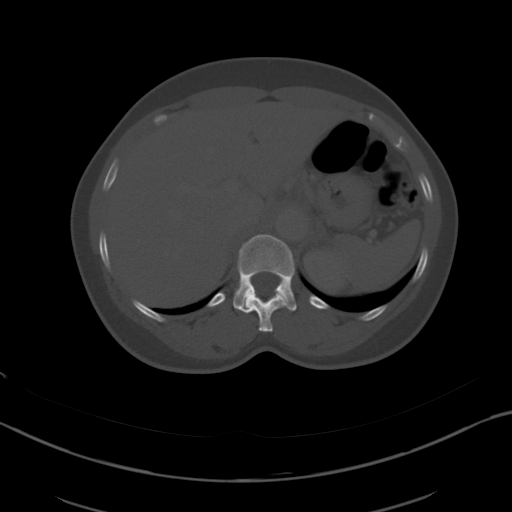
[im 71/86  soft-tissue]
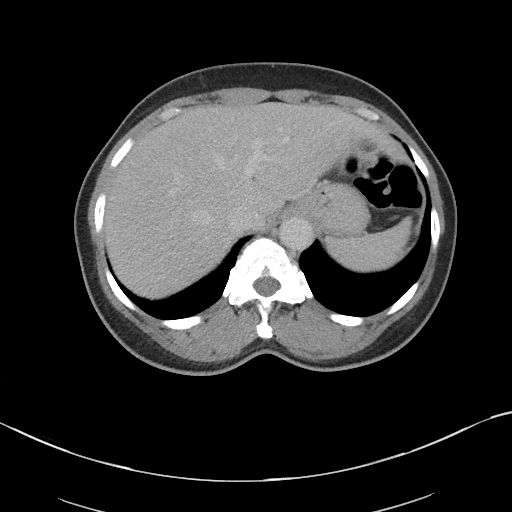
[im 81/86  soft-tissue]
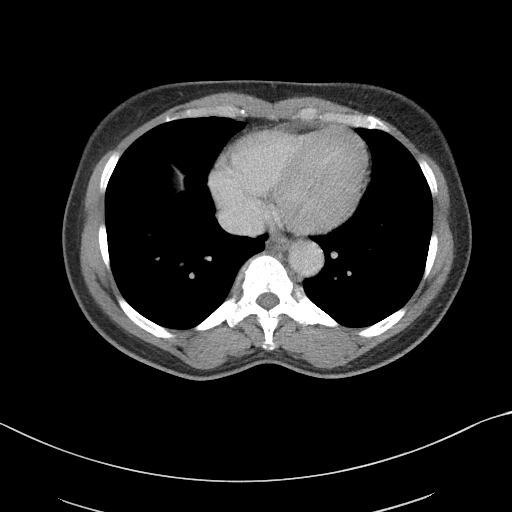

[Series 5: coronal st · coronal · 0.59mm/px · 3 of 84 slices shown]
[im 28/84  soft-tissue]
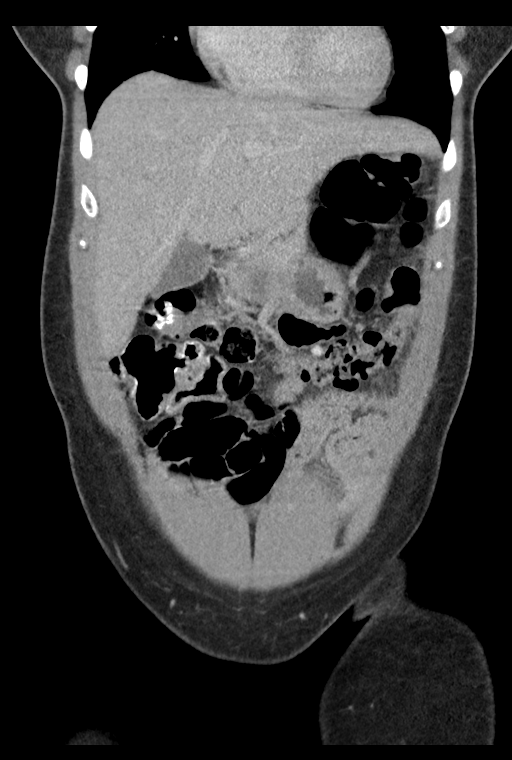
[im 37/84  soft-tissue]
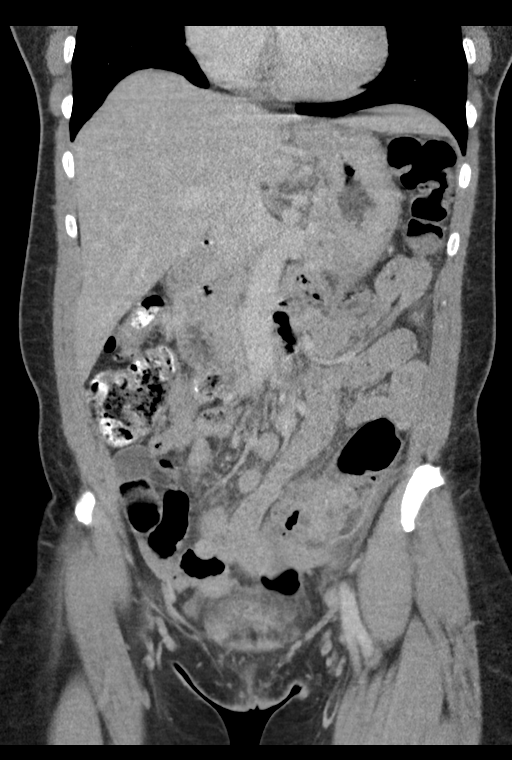
[im 47/84  soft-tissue]
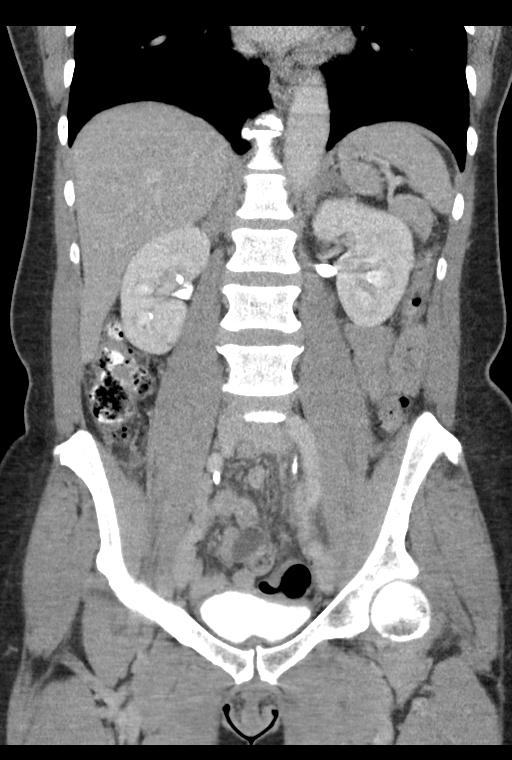

[14 of 46 positions shown; findings below may reference images not displayed]

FINDINGS: Lower chest: On axial slice 1 series 3, there is a stable 3 mm
nodular opacity in the lateral segment right middle lobe. Lung bases
otherwise are clear.

Hepatobiliary: No focal liver lesions are evident. There is a
Riedel's lobe on the right, an anatomic variant. Gallbladder wall is
not appreciably thickened. There is no biliary duct dilatation.

Pancreas: There is no appreciable pancreatic mass or inflammatory
focus.

Spleen: No splenic lesions are evident.

Adrenals/Urinary Tract: Adrenals bilaterally appear unremarkable.
There is a 1 x 1 cm cyst in the upper pole left kidney. No
hydronephrosis is evident on either side. No renal or ureteral
calculi are demonstrable. Contrast is seen within the collecting
systems and ureters, a finding which could obscure small calculi.
Urinary bladder is midline with wall thickness within normal limits.

Stomach/Bowel: There is a loop of proximal sigmoid colon in the left
mid pelvis which shows generalized wall thickening. This loop of
bowel with wall thickening extends over approximately 6 cm. There is
apparent luminal narrowing in this area. There is no surrounding
mesenteric thickening. No diverticular irregularity seen in this
area. No similar bowel wall thickening is seen elsewhere in the
abdomen or pelvis. No evident bowel obstruction. No free air or
portal venous air. The terminal ileum appears unremarkable.

Vascular/Lymphatic: There is no abdominal aortic aneurysm. No
vascular lesions are evident. There is no adenopathy in the abdomen
or pelvis.

Reproductive: Uterus is absent. No adnexal masses are evident. There
is moderate fluid in the dependent portion of the pelvis slightly to
the right of midline.

Other: Appendix appears normal. No abscess is evident in the abdomen
or pelvis.

Musculoskeletal: There are foci of degenerative change in the lumbar
spine. There are no blastic or lytic bone lesions. There is no
intramuscular or abdominal wall lesion.
IMPRESSION: 1. There is an approximately 6 cm long focus of concentric narrowing
of the proximal sigmoid colon. There is no appreciable diverticular
disease in this area. This area may represent localized colitis. A
concentric mass arising in this portion of the colon could present
in this manner. This finding warrants direct visualization to assess
for possible neoplastic lesion in the sigmoid colon. No other bowel
wall thickening. No diverticulitis appreciable. No bowel
obstruction.

2. Moderate free fluid is noted in the dependent portion of the
pelvis. Question recent ovarian cyst rupture.

3.  No abscess in the abdomen or pelvis.  Appendix appears normal.

4. No evident renal or ureteral calculus. No hydronephrosis. Note
that contrast is seen in the collecting systems and ureters which
could obscure small calculi. Due to technical issue, earlier images
were not able to be obtained showing renal parenchymal and ureters
without contrast present.

5.  3 mm nodular opacity right middle lobe, stable.

6.  Uterus absent.

## 2021-05-30 ENCOUNTER — Emergency Department (HOSPITAL_BASED_OUTPATIENT_CLINIC_OR_DEPARTMENT_OTHER): Payer: No Typology Code available for payment source

## 2021-05-30 ENCOUNTER — Encounter (HOSPITAL_BASED_OUTPATIENT_CLINIC_OR_DEPARTMENT_OTHER): Payer: Self-pay | Admitting: Emergency Medicine

## 2021-05-30 ENCOUNTER — Emergency Department (HOSPITAL_BASED_OUTPATIENT_CLINIC_OR_DEPARTMENT_OTHER)
Admission: EM | Admit: 2021-05-30 | Discharge: 2021-05-30 | Disposition: A | Discharge status: Home / Self Care | Payer: No Typology Code available for payment source | Attending: Emergency Medicine | Admitting: Emergency Medicine

## 2021-05-30 ENCOUNTER — Other Ambulatory Visit: Payer: Self-pay

## 2021-05-30 DIAGNOSIS — K5792 Diverticulitis of intestine, part unspecified, without perforation or abscess without bleeding: Secondary | ICD-10-CM | POA: Insufficient documentation

## 2021-05-30 DIAGNOSIS — R109 Unspecified abdominal pain: Secondary | ICD-10-CM | POA: Diagnosis present

## 2021-05-30 LAB — CBC WITH DIFFERENTIAL/PLATELET
Abs Immature Granulocytes: 0.02 10*3/uL (ref 0.00–0.07)
Basophils Absolute: 0 10*3/uL (ref 0.0–0.1)
Basophils Relative: 0 %
Eosinophils Absolute: 0.1 10*3/uL (ref 0.0–0.5)
Eosinophils Relative: 1 %
HCT: 36.5 % (ref 36.0–46.0)
Hemoglobin: 12.3 g/dL (ref 12.0–15.0)
Immature Granulocytes: 0 %
Lymphocytes Relative: 27 %
Lymphs Abs: 2.2 10*3/uL (ref 0.7–4.0)
MCH: 30.3 pg (ref 26.0–34.0)
MCHC: 33.7 g/dL (ref 30.0–36.0)
MCV: 89.9 fL (ref 80.0–100.0)
Monocytes Absolute: 1 10*3/uL (ref 0.1–1.0)
Monocytes Relative: 12 %
Neutro Abs: 5 10*3/uL (ref 1.7–7.7)
Neutrophils Relative %: 60 %
Platelets: 310 10*3/uL (ref 150–400)
RBC: 4.06 MIL/uL (ref 3.87–5.11)
RDW: 13.6 % (ref 11.5–15.5)
WBC: 8.4 10*3/uL (ref 4.0–10.5)
nRBC: 0 % (ref 0.0–0.2)

## 2021-05-30 LAB — COMPREHENSIVE METABOLIC PANEL
ALT: 30 U/L (ref 0–44)
AST: 27 U/L (ref 15–41)
Albumin: 3.4 g/dL — ABNORMAL LOW (ref 3.5–5.0)
Alkaline Phosphatase: 90 U/L (ref 38–126)
Anion gap: 6 (ref 5–15)
BUN: 14 mg/dL (ref 6–20)
CO2: 27 mmol/L (ref 22–32)
Calcium: 8.4 mg/dL — ABNORMAL LOW (ref 8.9–10.3)
Chloride: 100 mmol/L (ref 98–111)
Creatinine, Ser: 0.99 mg/dL (ref 0.44–1.00)
GFR, Estimated: >60 mL/min (ref >60)
Glucose, Bld: 91 mg/dL (ref 70–99)
Potassium: 4.4 mmol/L (ref 3.5–5.1)
Sodium: 133 mmol/L — ABNORMAL LOW (ref 135–145)
Total Bilirubin: 0.5 mg/dL (ref 0.3–1.2)
Total Protein: 7.1 g/dL (ref 6.5–8.1)

## 2021-05-30 LAB — URINALYSIS, ROUTINE W REFLEX MICROSCOPIC
Bilirubin Urine: NEGATIVE
Glucose, UA: NEGATIVE mg/dL
Hgb urine dipstick: NEGATIVE
Ketones, ur: NEGATIVE mg/dL
Leukocytes,Ua: NEGATIVE
Nitrite: NEGATIVE
Protein, ur: NEGATIVE mg/dL
Specific Gravity, Urine: 1.01 (ref 1.005–1.030)
pH: 5 (ref 5.0–8.0)

## 2021-05-30 LAB — LIPASE, BLOOD: Lipase: 68 U/L — ABNORMAL HIGH (ref 11–51)

## 2021-05-30 MED ORDER — AMOXICILLIN-POT CLAVULANATE 875-125 MG PO TABS
1.0000 | ORAL_TABLET | Freq: Once | ORAL | Status: AC
  Administered 2021-05-30: 1 via ORAL
  Filled 2021-05-30: qty 1

## 2021-05-30 MED ORDER — OXYCODONE-ACETAMINOPHEN 5-325 MG PO TABS: 1.0000 | ORAL_TABLET | Freq: Four times a day (QID) | ORAL | 0 refills | Status: DC | PRN

## 2021-05-30 MED ORDER — AMOXICILLIN-POT CLAVULANATE 875-125 MG PO TABS: 1.0000 | ORAL_TABLET | Freq: Two times a day (BID) | ORAL | 0 refills | Status: DC

## 2021-05-30 MED ORDER — MORPHINE SULFATE (PF) 4 MG/ML IV SOLN
4.0000 mg | Freq: Once | INTRAVENOUS | Status: AC
  Administered 2021-05-30: 4 mg via INTRAVENOUS
  Filled 2021-05-30: qty 1

## 2021-05-30 MED ORDER — ONDANSETRON HCL 4 MG/2ML IJ SOLN
4.0000 mg | Freq: Once | INTRAMUSCULAR | Status: AC
  Administered 2021-05-30: 4 mg via INTRAVENOUS
  Filled 2021-05-30: qty 2

## 2021-05-30 MED ORDER — MORPHINE SULFATE (PF) 4 MG/ML IV SOLN
4.0000 mg | Freq: Once | INTRAVENOUS | Status: AC
Start: 2021-05-30 — End: 2021-05-30
  Administered 2021-05-30: 4 mg via INTRAVENOUS
  Filled 2021-05-30: qty 1

## 2021-05-30 MED ORDER — IOHEXOL 350 MG/ML SOLN
100.0000 mL | Freq: Once | INTRAVENOUS | Status: AC | PRN
  Administered 2021-05-30: 100 mL via INTRAVENOUS

## 2021-05-30 NOTE — ED Provider Notes (Signed)
[facility] EMERGENCY DEPARTMENT Provider Note   CSN: [id number] Arrival date & time: 05/30/21  1853     History Chief Complaint  Patient presents with   Abdominal Pain    Caitlyn Sosa is a 55 y.o. female.   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  Pain started yesterday its been constant and progressively worsening.  The pain is worsened by moving.  It radiates to the left flank, she has had pain like this before in the past with diverticulitis.  Denies any bloody stool, hematuria, dysuria.  No history of kidney stones.  She has not had any nausea or vomiting, reports last bowel movement yesterday but she is passing gas.   Past Medical History:  Diagnosis Date   Diverticulitis     There are no problems to display for this patient.   Past Surgical History:  Procedure Laterality Date   ABDOMINAL HYSTERECTOMY     HERNIA REPAIR       OB History   No obstetric history on file.     No family history on file.  Social History   Tobacco Use   Smoking status: Never   Smokeless tobacco: Never  Vaping Use   Vaping Use: Never used  Substance Use Topics   Alcohol use: No   Drug use: No    Home Medications Prior to Admission medications   Medication Sig Start Date End Date Taking? Authorizing Provider  benzonatate (TESSALON) 100 MG capsule Take 1 capsule (100 mg total) by mouth every 8 (eight) hours. 03/12/20   Caccavale, Sophia, PA-C  diclofenac Sodium (VOLTAREN) 1 % GEL Apply 2 g topically 4 (four) times daily. 03/12/20   Caccavale, Sophia, PA-C  metroNIDAZOLE (FLAGYL) 500 MG tablet Take 1 tablet (500 mg total) by mouth 3 (three) times daily. 03/31/19    Bethel Born, PA-C  ondansetron (ZOFRAN ODT) 4 MG disintegrating tablet Take 1 tablet (4 mg total) by mouth every 8 (eight) hours as needed for nausea or vomiting. 03/31/19   Bethel Born, PA-C    Allergies    Patient has no known allergies.  Review of Systems   Review of Systems  Constitutional:  Negative for chills and fever.  HENT:  Negative for ear pain and sore throat.   Eyes:  Negative for pain and visual disturbance.  Respiratory:  Negative for cough and shortness of breath.   Cardiovascular:  Negative for chest pain and palpitations.  Gastrointestinal:  Positive for abdominal pain. Negative for blood in stool, constipation, diarrhea, nausea and vomiting.  Genitourinary:  Negative for dysuria and hematuria.  Musculoskeletal:  Negative for arthralgias and back pain.  Skin:  Negative for color change and rash.  Neurological:  Negative for seizures and syncope.  All other systems reviewed and are negative.  Physical Exam Updated Vital Signs BP 130/79   Pulse 90   Temp 98.3 F (36.8 C) (Oral)   Resp 20   Ht 5\' 2"  (1.575 m)   Wt 65.8 kg   SpO2 99%   BMI 26.52 kg/m   Physical Exam Vitals and nursing note reviewed. Exam conducted with a chaperone present.  Constitutional:      Appearance: Normal appearance.  HENT:  Head: Normocephalic and atraumatic.  Eyes:     General: No scleral icterus.       Right eye: No discharge.        Left eye: No discharge.     Extraocular Movements: Extraocular movements intact.     Pupils: Pupils are equal, round, and reactive to light.  Cardiovascular:     Rate and Rhythm: Normal rate and regular rhythm.     Pulses: Normal pulses.     Heart sounds: Normal heart sounds. No murmur heard.   No friction rub. No gallop.  Pulmonary:     Effort: Pulmonary effort is normal. No respiratory distress.     Breath sounds: Normal breath sounds.  Abdominal:     General: Abdomen is flat. Bowel sounds are normal. There is no distension.      Palpations: Abdomen is soft.     Tenderness: There is abdominal tenderness in the left upper quadrant and left lower quadrant. There is left CVA tenderness. There is no right CVA tenderness.     Comments: Abdomen soft, voluntary guarding.  Tenderness to the left lower quadrant primarily, also left upper quadrant tenderness as well as left CVA tenderness.  Skin:    General: Skin is warm and dry.     Coloration: Skin is not jaundiced.  Neurological:     Mental Status: She is alert. Mental status is at baseline.     Coordination: Coordination normal.    ED Results / Procedures / Treatments   Labs (all labs ordered are listed, but only abnormal results are displayed) Labs Reviewed  CBC WITH DIFFERENTIAL/PLATELET  COMPREHENSIVE METABOLIC PANEL  LIPASE, BLOOD  URINALYSIS, ROUTINE W REFLEX MICROSCOPIC    EKG None  Radiology No results found.  Procedures Procedures   Medications Ordered in ED Medications  morphine 4 MG/ML injection 4 mg (has no administration in time range)  ondansetron (ZOFRAN) injection 4 mg (has no administration in time range)    ED Course  I have reviewed the triage vital signs and the nursing notes.  Pertinent labs & imaging results that were available during my care of the patient were reviewed by me and considered in my medical decision making (see chart for details).    MDM Rules/Calculators/A&P                           Patient vitals are stable, she is very tender to the left quadrant as well as with left flank pain.  Kidney stone is on the differential, diverticulitis is also on the differential.  No rigidity, but there is voluntary guarding.  CT with diverticulitis. No perforation or abscess.  Vitals stable, pain is controlled.  Patient is appropriate for antibiotic treatment outpatient with follow-up with GI.  Final Clinical Impression(s) / ED Diagnoses Final diagnoses:  None    Rx / DC Orders ED Discharge Orders     None         Theron Arista, Cordelia Poche 05/30/21 2116    Rolan Bucco, MD 05/30/21 2148

## 2021-05-30 NOTE — ED Triage Notes (Addendum)
Pt reports LLQ pain with radiation to LT back; started yesterday, worse today

## 2021-05-30 NOTE — Discharge Instructions (Addendum)
You are given Augmentin today in the ED.  Starting tomorrow morning take Augmentin twice daily for the next 7 days. Schedule follow-up with your GI physician, information is provided if you do not have 1 already. Follow a bland diet, information is attached.  Take the pain medicine sparingly as needed.

## 2022-06-23 IMAGING — CT CT ABD-PELV W/ CM
2 of 5 series · 15 of 46 positions shown, 17 images · IV contrast (omnipaque)
Comparison: 03/31/2019

CLINICAL DATA: Left lower quadrant pain for 2 days, initial
encounter

EXAM:
CT ABDOMEN AND PELVIS WITH CONTRAST
TECHNIQUE: Multidetector CT imaging of the abdomen and pelvis was performed
using the standard protocol following bolus administration of
intravenous contrast.
CONTRAST:  100mL OMNIPAQUE IOHEXOL 350 MG/ML SOLN

[Series 2: axial st · axial · 0.79mm/px · z∈[-352,+48]mm · 12 of 90 slices shown, 14 images]
[im 5/90  soft-tissue]
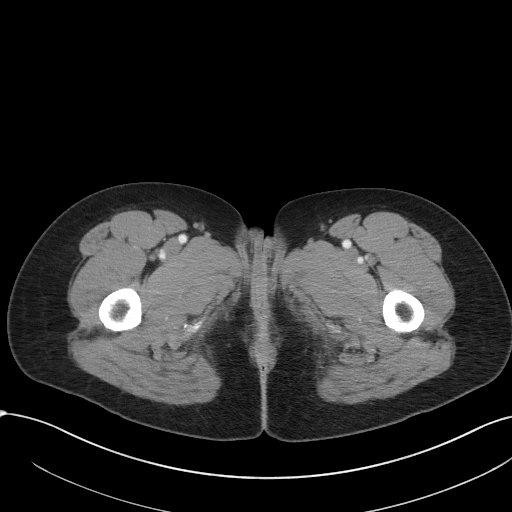
[im 5/90  bone]
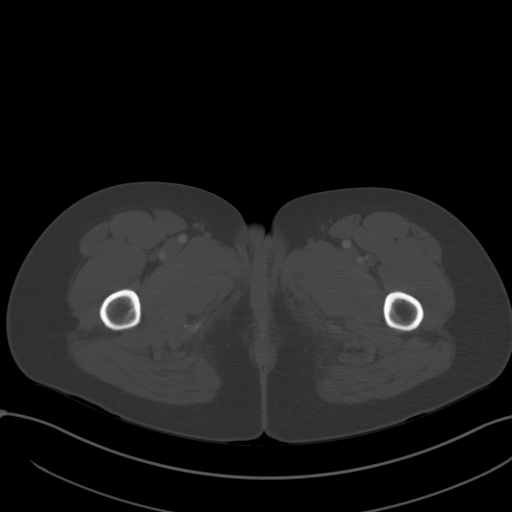
[im 15/90  soft-tissue]
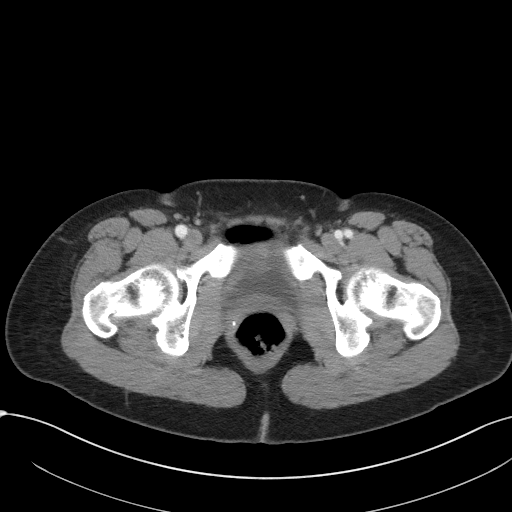
[im 20/90  soft-tissue]
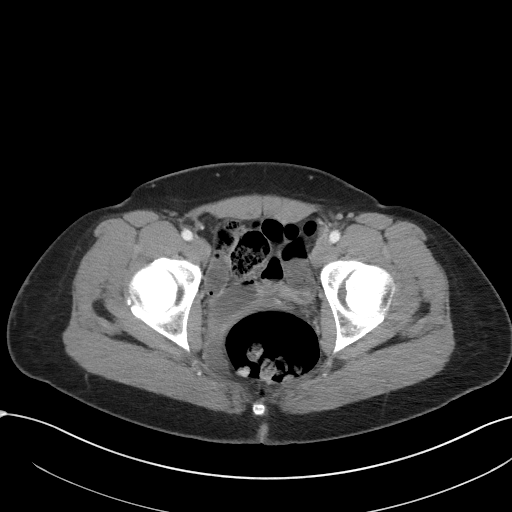
[im 25/90  soft-tissue]
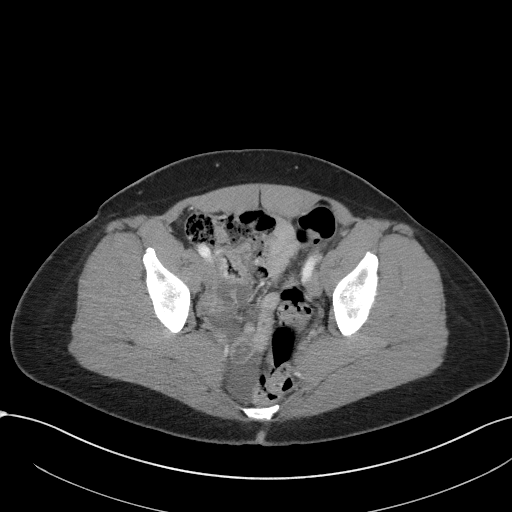
[im 35/90  soft-tissue]
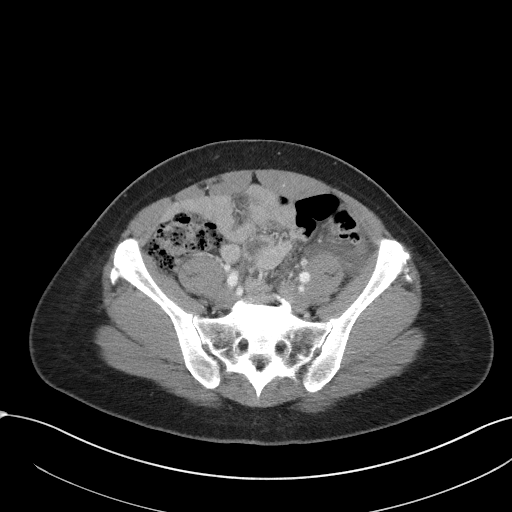
[im 40/90  soft-tissue]
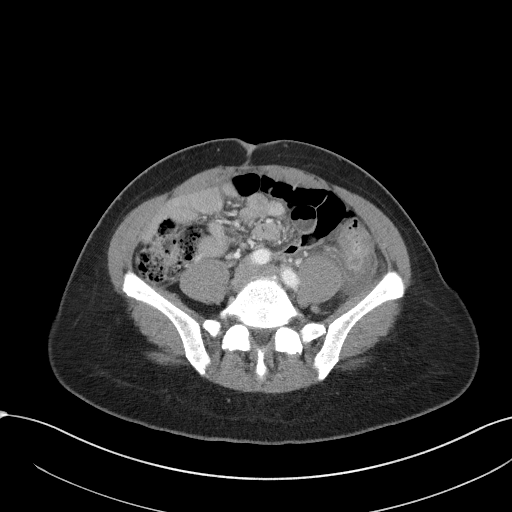
[im 50/90  soft-tissue]
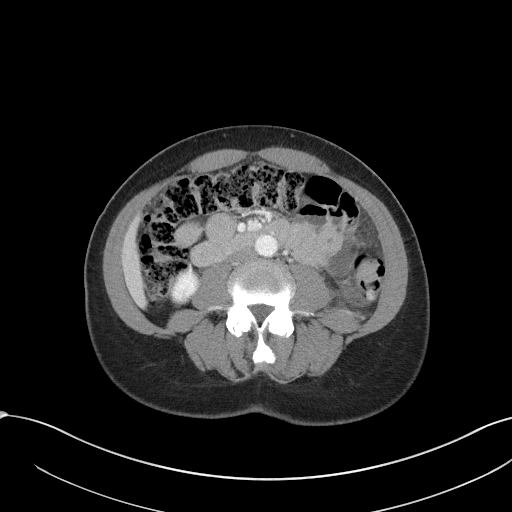
[im 55/90  soft-tissue]
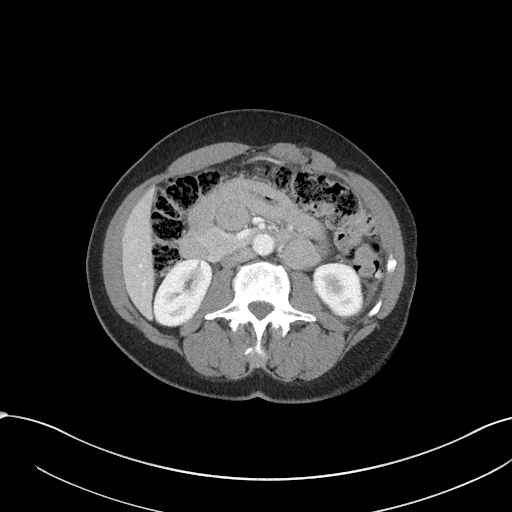
[im 65/90  soft-tissue]
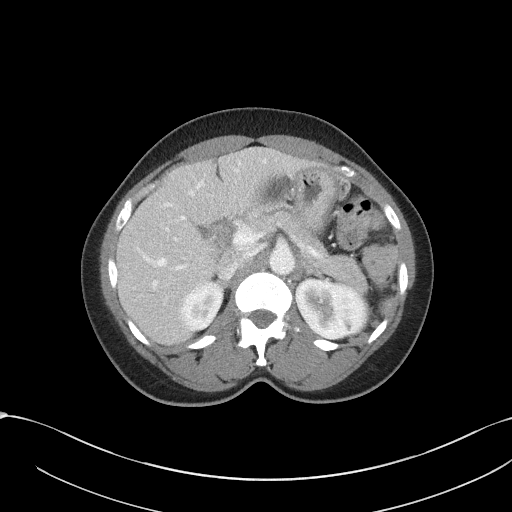
[im 65/90  bone]
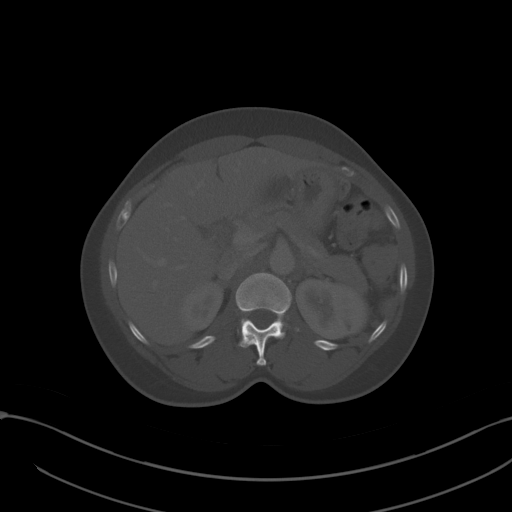
[im 70/90  soft-tissue]
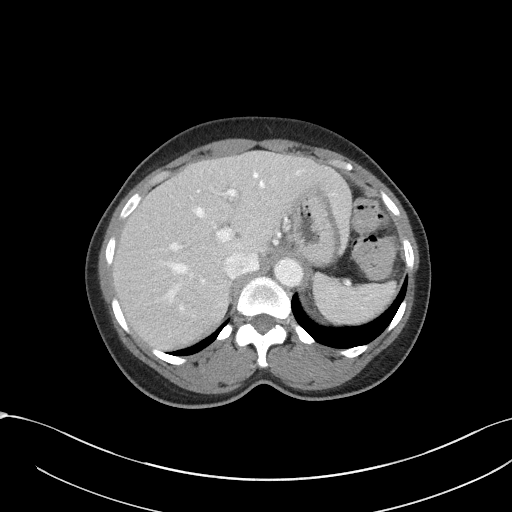
[im 75/90  soft-tissue]
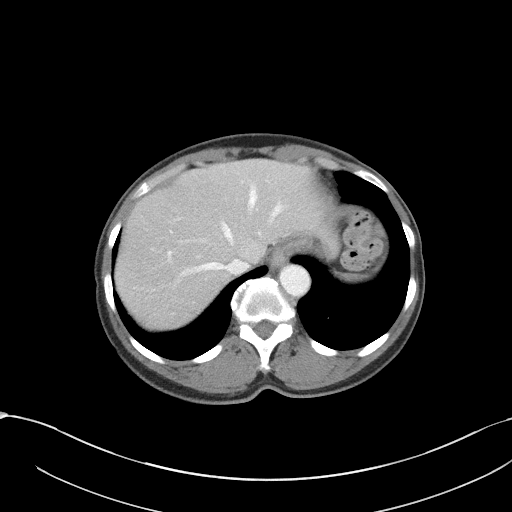
[im 85/90  soft-tissue]
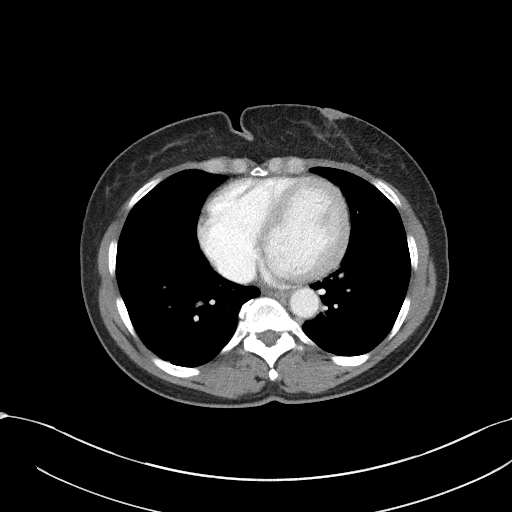

[Series 5: coronal st · coronal · 0.57mm/px · 3 of 99 slices shown]
[im 33/99  soft-tissue]
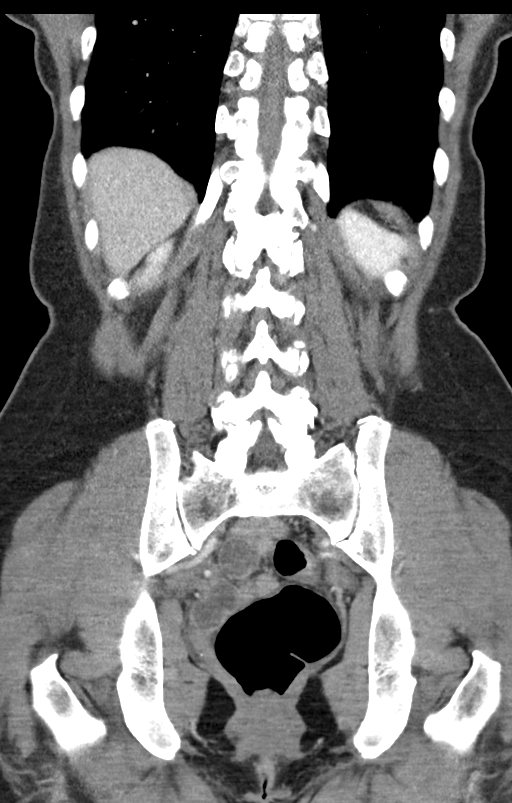
[im 44/99  soft-tissue]
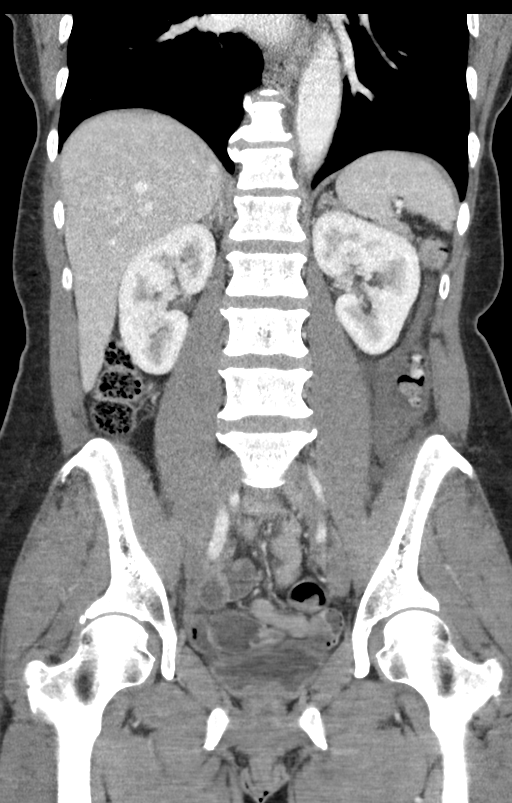
[im 55/99  soft-tissue]
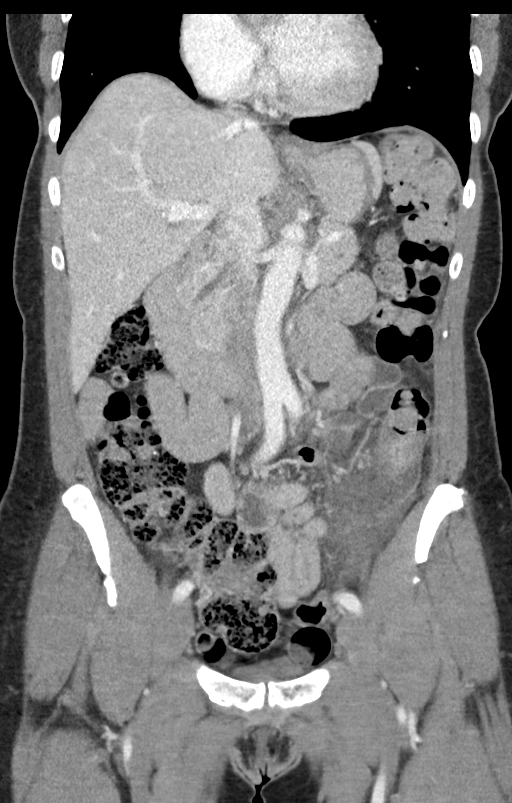

[15 of 46 positions shown; findings below may reference images not displayed]

FINDINGS: Lower chest: No acute abnormality.

Hepatobiliary: No focal liver abnormality is seen. No gallstones,
gallbladder wall thickening, or biliary dilatation.

Pancreas: Unremarkable. No pancreatic ductal dilatation or
surrounding inflammatory changes.

Spleen: Normal in size without focal abnormality.

Adrenals/Urinary Tract: Adrenal glands are within normal limits.
Kidneys demonstrate a normal enhancement pattern bilaterally. No
renal calculi or obstructive changes are seen. The bladder is
decompressed.

Stomach/Bowel: Colon demonstrates evidence of diverticular change
with focal diverticulitis in the distal descending colon/proximal
sigmoid colon. No discrete abscess is noted at this time. The
appendix is not well visualized and may have been surgically
removed. No inflammatory changes are seen. Small bowel and stomach
appear within normal limits.

Vascular/Lymphatic: No significant vascular findings are present. No
enlarged abdominal or pelvic lymph nodes.

Reproductive: Status post hysterectomy. No adnexal masses.

Other: Mild free fluid is noted within the pelvis likely reactive in
nature to the diverticular disease.

Musculoskeletal: Degenerative change of the lumbar spine is noted.
IMPRESSION: Changes consistent with diverticulitis at the junction of the
descending and sigmoid colons. No abscess or perforation is noted.

No other focal abnormality is noted.

## 2022-07-15 DIAGNOSIS — J3089 Other allergic rhinitis: Secondary | ICD-10-CM | POA: Insufficient documentation

## 2023-03-30 ENCOUNTER — Encounter (HOSPITAL_BASED_OUTPATIENT_CLINIC_OR_DEPARTMENT_OTHER): Payer: Self-pay | Admitting: Emergency Medicine

## 2023-03-30 ENCOUNTER — Emergency Department (HOSPITAL_BASED_OUTPATIENT_CLINIC_OR_DEPARTMENT_OTHER): Payer: No Typology Code available for payment source

## 2023-03-30 ENCOUNTER — Emergency Department (HOSPITAL_BASED_OUTPATIENT_CLINIC_OR_DEPARTMENT_OTHER)
Admission: EM | Admit: 2023-03-30 | Discharge: 2023-03-30 | Disposition: A | Discharge status: Home / Self Care | Payer: No Typology Code available for payment source | Attending: Emergency Medicine | Admitting: Emergency Medicine

## 2023-03-30 ENCOUNTER — Other Ambulatory Visit: Payer: Self-pay

## 2023-03-30 DIAGNOSIS — R0789 Other chest pain: Secondary | ICD-10-CM | POA: Insufficient documentation

## 2023-03-30 DIAGNOSIS — I10 Essential (primary) hypertension: Secondary | ICD-10-CM | POA: Diagnosis not present

## 2023-03-30 LAB — TROPONIN I (HIGH SENSITIVITY)
Troponin I (High Sensitivity): 4 ng/L (ref <18)
Troponin I (High Sensitivity): 3 ng/L (ref <18)

## 2023-03-30 LAB — BASIC METABOLIC PANEL
Anion gap: 9 (ref 5–15)
BUN: 18 mg/dL (ref 6–20)
CO2: 26 mmol/L (ref 22–32)
Calcium: 8.4 mg/dL — ABNORMAL LOW (ref 8.9–10.3)
Chloride: 102 mmol/L (ref 98–111)
Creatinine, Ser: 0.82 mg/dL (ref 0.44–1.00)
GFR, Estimated: >60 mL/min (ref >60)
Glucose, Bld: 97 mg/dL (ref 70–99)
Potassium: 3.9 mmol/L (ref 3.5–5.1)
Sodium: 137 mmol/L (ref 135–145)

## 2023-03-30 LAB — CBC
HCT: 36.5 % (ref 36.0–46.0)
Hemoglobin: 12.2 g/dL (ref 12.0–15.0)
MCH: 30 pg (ref 26.0–34.0)
MCHC: 33.4 g/dL (ref 30.0–36.0)
MCV: 89.7 fL (ref 80.0–100.0)
Platelets: 287 10*3/uL (ref 150–400)
RBC: 4.07 MIL/uL (ref 3.87–5.11)
RDW: 14 % (ref 11.5–15.5)
WBC: 5.3 10*3/uL (ref 4.0–10.5)
nRBC: 0 % (ref 0.0–0.2)

## 2023-03-30 LAB — D-DIMER, QUANTITATIVE: D-Dimer, Quant: <0.27 ug/mL-FEU (ref 0.00–0.50)

## 2023-03-30 MED ORDER — KETOROLAC TROMETHAMINE 15 MG/ML IJ SOLN
15.0000 mg | Freq: Once | INTRAMUSCULAR | Status: AC
  Administered 2023-03-30: 15 mg via INTRAVENOUS
  Filled 2023-03-30: qty 1

## 2023-03-30 MED ORDER — ALUM & MAG HYDROXIDE-SIMETH 200-200-20 MG/5ML PO SUSP
30.0000 mL | Freq: Once | ORAL | Status: AC
  Administered 2023-03-30: 30 mL via ORAL
  Filled 2023-03-30: qty 30

## 2023-03-30 NOTE — Discharge Instructions (Addendum)
No evidence of heart attack or blood clot in the lung.  Your chest pain may be musculoskeletal in origin.  You may use anti-inflammatories as needed. You should follow-up with a cardiologist for consideration of other testing (like a stress test).  Referral was sent to [facility] cardiology to see if they can see you sooner.  Return to the ED sooner with exertional chest pain, pain associate with shortness of breath, nausea, vomiting, sweating or other concerns.

## 2023-03-30 NOTE — ED Provider Notes (Signed)
[facility] Provider Note   CSN: [id number] Arrival date & time: 03/30/23  4010     History  Chief Complaint  Patient presents with   Chest Pain    Caitlyn Sosa is a 57 y.o. female.  Patient denies any medical history.  She is here with a 3-day history of constant central chest pain and "discomfort".  Pain is in the center of her chest without radiation.  Does not radiate to her arm, neck or back.  Worse with leaning forward.  Not reproducible.  Denies any fall or injury.  No associated shortness of breath, nausea, vomiting, cough, fever, sore throat or runny nose.  No diaphoresis. She denies any cardiac history.  She mention to her PCP that she was having this chest pain earlier in the month is being referred to cardiology but does not have an appointment until September.  She became concerned because the pain has been constant for the past 3 days but she been having this pain intermittently for many weeks.  Pain is not necessarily exertional or pleuritic. 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  The history is provided by the patient.  Chest Pain Associated symptoms: no abdominal pain, no fatigue, no headache, no nausea, no shortness of breath and no vomiting        Home Medications Prior to Admission medications   Medication Sig Start Date End Date Taking? Authorizing Provider  amoxicillin-clavulanate (AUGMENTIN) 875-125 MG tablet Take 1 tablet by mouth every 12 (twelve) hours. 05/30/21   Theron Arista, PA-C  benzonatate (TESSALON) 100 MG capsule Take 1 capsule (100 mg total) by mouth every 8 (eight) hours. 03/12/20   Caccavale, Sophia, PA-C  diclofenac Sodium (VOLTAREN) 1 % GEL Apply 2 g topically 4 (four) times daily. 03/12/20   Caccavale, Sophia, PA-C  ondansetron (ZOFRAN ODT) 4 MG disintegrating tablet Take 1 tablet (4 mg total) by mouth every 8 (eight) hours as needed for nausea or vomiting.  03/31/19   Bethel Born, PA-C  oxyCODONE-acetaminophen (PERCOCET/ROXICET) 5-325 MG tablet Take 1 tablet by mouth every 6 (six) hours as needed for severe pain. 05/30/21   Theron Arista, PA-C      Allergies    Patient has no known allergies.    Review of Systems   Review of Systems  Constitutional:  Negative for activity change, appetite change and fatigue.  HENT:  Negative for congestion and rhinorrhea.   Respiratory:  Positive for chest tightness. Negative for shortness of breath.   Cardiovascular:  Positive for chest pain.  Gastrointestinal:  Negative for abdominal pain, nausea and vomiting.  Genitourinary:  Negative for dysuria and hematuria.  Musculoskeletal:  Negative for arthralgias and myalgias.  Skin:  Negative for rash.  Neurological:  Negative for headaches.   all other systems are negative except as noted in the HPI and PMH.    Physical Exam Updated Vital Signs BP (!) 156/107   Pulse 78   Temp 97.8 F (36.6 C) (Oral)   Resp 20   Wt 64 kg   SpO2 100%   BMI 25.79 kg/m  Physical Exam Vitals and nursing note reviewed.  Constitutional:      General: She is not in acute distress.    Appearance: She is well-developed.  HENT:     Head: Normocephalic and atraumatic.     Mouth/Throat:     Pharynx: No oropharyngeal exudate.  Eyes:     Conjunctiva/sclera:  Conjunctivae normal.     Pupils: Pupils are equal, round, and reactive to light.  Neck:     Comments: No meningismus. Cardiovascular:     Rate and Rhythm: Normal rate and regular rhythm.     Heart sounds: Normal heart sounds. No murmur heard. Pulmonary:     Effort: Pulmonary effort is normal. No respiratory distress.     Breath sounds: Normal breath sounds.  Chest:     Chest wall: Tenderness present.  Abdominal:     Palpations: Abdomen is soft.     Tenderness: There is no abdominal tenderness. There is no guarding or rebound.  Musculoskeletal:        General: No tenderness. Normal range of motion.      Cervical back: Normal range of motion and neck supple.  Skin:    General: Skin is warm.  Neurological:     Mental Status: She is alert and oriented to person, place, and time.     Cranial Nerves: No cranial nerve deficit.     Motor: No abnormal muscle tone.     Coordination: Coordination normal.     Comments:  5/5 strength throughout. CN 2-12 intact.Equal grip strength.   Psychiatric:        Behavior: Behavior normal.     ED Results / Procedures / Treatments   Labs (all labs ordered are listed, but only abnormal results are displayed) Labs Reviewed  BASIC METABOLIC PANEL - Abnormal; Notable for the following components:      Result Value   Calcium 8.4 (*)    All other components within normal limits  CBC  D-DIMER, QUANTITATIVE  TROPONIN I (HIGH SENSITIVITY)    EKG EKG Interpretation Date/Time:  Wednesday March 30 2023 05:20:35 EDT Ventricular Rate:  81 PR Interval:  159 QRS Duration:  90 QT Interval:  384 QTC Calculation: 446 R Axis:   77  Text Interpretation: Sinus rhythm Minimal ST depression, lateral leads No previous ECGs available Confirmed by Glynn Octave [phone]) on 03/30/2023 5:48:07 AM  Radiology DG Chest 2 View  Result Date: 03/30/2023 CLINICAL DATA:  Sub sternal chest pain. EXAM: CHEST - 2 VIEW COMPARISON:  None Available. FINDINGS: Heart size and mediastinal contours are unremarkable. There is no pleural fluid or interstitial edema. No airspace opacities identified. Visualized osseous structures are unremarkable. IMPRESSION: No active cardiopulmonary disease. Electronically Signed   By: Signa Kell M.D.   On: 03/30/2023 06:25    Procedures Procedures    Medications Ordered in ED Medications  alum & mag hydroxide-simeth (MAALOX/MYLANTA) 200-200-20 MG/5ML suspension 30 mL (has no administration in time range)    ED Course/ Medical Decision Making/ A&P                                 Medical Decision Making Amount and/or Complexity of Data  Reviewed Labs: ordered. Decision-making details documented in ED Course. Radiology: ordered and independent interpretation performed. Decision-making details documented in ED Course. ECG/medicine tests: ordered and independent interpretation performed. Decision-making details documented in ED Course.  Risk OTC drugs.   3 days of central chest pain constant.  Nothing makes it better or worse.  Vital stable, no distress.  Mild hypertension on arrival.  EKG no acute ischemia.  Pain has been constant for 3 days.  Is somewhat reproducible.  Low suspicion for ACS, PE, aortic dissection.  Will obtain chest x-ray and screening labs.  Chest x-ray is negative.  No  pneumonia or pneumothorax.  Results reviewed interpreted by me.  Troponin negative.  D-dimer negative.  Low suspicion for ACS.  Will obtain serial troponin. Heart score is 1.   Will send referral to [facility] cardiology in case [facility] can [facility] than [facility].  Low suspicion for PE or aortic dissection.  Pain likely musculoskeletal in origin with possible GI component as well.  Care transferred at shift change pending second troponin.        Final Clinical Impression(s) / ED Diagnoses Final diagnoses:  Atypical chest pain    Rx / DC Orders ED Discharge Orders     None         Querida Beretta, Jeannett Senior, MD 03/30/23 [phone]

## 2023-03-30 NOTE — ED Triage Notes (Signed)
Substernal CP that started Saturday with some radiation to back, worse with lying flat, not reproducible with palpation. Has been noted in physical with PCP, has cardiologist appt in September.  Denies SOB, N/V

## 2023-04-01 DIAGNOSIS — F5101 Primary insomnia: Secondary | ICD-10-CM | POA: Insufficient documentation

## 2023-04-01 DIAGNOSIS — D704 Cyclic neutropenia: Secondary | ICD-10-CM | POA: Insufficient documentation

## 2023-04-01 DIAGNOSIS — R030 Elevated blood-pressure reading, without diagnosis of hypertension: Secondary | ICD-10-CM | POA: Insufficient documentation

## 2023-04-05 ENCOUNTER — Encounter: Payer: Self-pay | Admitting: Cardiology

## 2023-04-05 ENCOUNTER — Ambulatory Visit: Payer: No Typology Code available for payment source | Attending: Cardiology | Admitting: Cardiology

## 2023-04-05 VITALS — BP 132/90 | HR 68 | Ht 62.0 in | Wt 149.0 lb

## 2023-04-05 DIAGNOSIS — R072 Precordial pain: Secondary | ICD-10-CM

## 2023-04-05 DIAGNOSIS — E785 Hyperlipidemia, unspecified: Secondary | ICD-10-CM | POA: Diagnosis not present

## 2023-04-05 DIAGNOSIS — R079 Chest pain, unspecified: Secondary | ICD-10-CM

## 2023-04-05 DIAGNOSIS — R0789 Other chest pain: Secondary | ICD-10-CM | POA: Insufficient documentation

## 2023-04-05 DIAGNOSIS — R030 Elevated blood-pressure reading, without diagnosis of hypertension: Secondary | ICD-10-CM | POA: Diagnosis not present

## 2023-04-05 MED ORDER — METOPROLOL TARTRATE 100 MG PO TABS: ORAL_TABLET | ORAL | 0 refills | Status: DC

## 2023-04-05 NOTE — Addendum Note (Signed)
Addended by: Baldo Ash D on: 04/05/2023 04:42 PM   Modules accepted: Orders

## 2023-04-05 NOTE — Patient Instructions (Addendum)
Medication Instructions:   TAKE: Metoprolol 100mg  1 tablet 2 hours prior to CT scan   Lab Work: CRP, Sed Rate  3rd Floor    [address]   Your physician recommends that you return for lab work in: when lab open  You need to have labs done when you are fasting.  You can come Monday through Friday 8:00 am to 11:30am and 1:00 to 4:00. You do not need to make an appointment as the order has already been placed.     Testing/Procedures:  Your cardiac CT will be scheduled at one of the below locations:   [facility] [address] [place], [place] [address] [phone]   If scheduled at [facility], please arrive at the [facility] and Children's Entrance (Entrance C2) of [facility] 30 minutes prior to test start time. You can use the FREE valet parking offered at entrance C (encouraged to control the heart rate for the test)  Proceed to the [facility] Radiology Department (first floor) to check-in and test prep.  All radiology patients and guests should use entrance C2 at [facility], accessed from [facility], even though the hospital's physical address listed is [address]      Please follow these instructions carefully (unless otherwise directed):   On the Night Before the Test: Be sure to Drink plenty of water. Do not consume any caffeinated/decaffeinated beverages or chocolate 12 hours prior to your test. Do not take any antihistamines 12 hours prior to your test.  On the Day of the Test: Drink plenty of water until 1 hour prior to the test. Do not eat any food 4 hours prior to the test. You may take your regular medications prior to the test.  Take metoprolol (Lopressor) two hours prior to test. FEMALES- please wear underwire-free bra if available, avoid dresses & tight clothing       After the Test: Drink plenty of water. After receiving IV contrast, you may experience a mild flushed feeling. This is  normal. On occasion, you may experience a mild rash up to 24 hours after the test. This is not dangerous. If this occurs, you can take Benadryl 25 mg and increase your fluid intake. If you experience trouble breathing, this can be serious. If it is severe call 911 IMMEDIATELY. If it is mild, please call our office. If you take any of these medications: Glipizide/Metformin, Avandament, Glucavance, please do not take 48 hours after completing test unless otherwise instructed.  We will call to schedule your test 2-4 weeks out understanding that some insurance companies will need an authorization prior to the service being performed.   For non-scheduling related questions, please contact the cardiac imaging nurse navigator should you have any questions/concerns: Caitlyn Sosa, Cardiac Imaging Nurse Navigator Caitlyn Sosa, Cardiac Imaging Nurse Navigator [facility] Direct Office Dial: [phone]   For scheduling needs, including cancellations and rescheduling, please call Caitlyn Sosa, [phone].    Follow-Up: At [facility], you and your health needs are our priority.  As part of our continuing mission to provide you with exceptional heart care, we have created designated Provider Care Teams.  These Care Teams include your primary Cardiologist (physician) and Advanced Practice Providers (APPs -  Physician Assistants and Nurse Practitioners) who all work together to provide you with the care you need, when you need it.  We recommend signing up for the patient portal called "MyChart".  Sign up information  is provided on this After Visit Summary.  MyChart is used to connect with patients for Virtual Visits (Telemedicine).  Patients are able to view lab/test results, encounter notes, upcoming appointments, etc.  Non-urgent messages can be sent to your provider as well.   To learn more about what you can do with MyChart, go to [email or web address].    Your next  appointment:   6 week(s)  The format for your next appointment:   In Person  Provider:   Gypsy Balsam, MD    Other Instructions NA

## 2023-04-05 NOTE — Progress Notes (Signed)
Cardiology Consultation:    Date:  04/05/2023   ID:  Caitlyn Sosa, DOB 20-Feb-1966, MRN [id number]  PCP:  Angelica Chessman, MD  Cardiologist:  Gypsy Balsam, MD   Referring MD: Glynn Octave, MD   Chief Complaint  Patient presents with   Chest Pain    History of Present Illness:    Caitlyn Sosa is a 57 y.o. female who is being seen today for the evaluation of chest pain at the request of Rancour, Stephen, MD. past medical history significant for hyperlipidemia, multiple family members with hypertension diabetes but they do not take care of themselves.  She was referred to me because of chest pain.  She got chest pain ongoing for about 2 weeks.  She end up being in the emergency room when she went there she got constant sensation lasting for 2 days, troponin were negative.  She described pain as heavy sensation in the middle of the chest sometimes sensation gets quite bad she grades this in scale up to 10 to #8.  There is no provoking or relieving factors.  Interesting when she was in the emergency room she was given Maalox and she thinks it may help her since that time she tried to take some Tums but does not have much relief.  She exercise on the regular basis about 6 times a week but lately she is afraid to push herself because she worried about developing pain she does not develop exertional pain she just simply afraid that it may happen.  She smoked for short period of time long time ago.  She is try to eat healthy and she is trying to stay in shape she has multiple family members with obesity diabetes hypertension but they do not take care of himself and she basically trying to avoid problems the day and the facing.  When she gets chest pain there is no swelling there is no shortness of breath taking deep breath may make this a little bit worse but she is not sure moving a certain way can make it worse for example she described to me situation which when she was trying to do yoga  and she felt that sensation.  Tried to distract her years ago she was AT her daughter who is 29 years old to COVID and she is taking care of her children.  Past Medical History:  Diagnosis Date   Diverticulitis     Past Surgical History:  Procedure Laterality Date   ABDOMINAL HYSTERECTOMY     HERNIA REPAIR      Current Medications: Current Meds  Medication Sig   [DISCONTINUED] amoxicillin-clavulanate (AUGMENTIN) 875-125 MG tablet Take 1 tablet by mouth every 12 (twelve) hours.   [DISCONTINUED] benzonatate (TESSALON) 100 MG capsule Take 1 capsule (100 mg total) by mouth every 8 (eight) hours.   [DISCONTINUED] diclofenac Sodium (VOLTAREN) 1 % GEL Apply 2 g topically 4 (four) times daily.   [DISCONTINUED] ondansetron (ZOFRAN ODT) 4 MG disintegrating tablet Take 1 tablet (4 mg total) by mouth every 8 (eight) hours as needed for nausea or vomiting.   [DISCONTINUED] oxyCODONE-acetaminophen (PERCOCET/ROXICET) 5-325 MG tablet Take 1 tablet by mouth every 6 (six) hours as needed for severe pain.     Allergies:   Other   Social History   Socioeconomic History   Marital status: Married    Spouse name: Not on file   Number of children: Not on file   Years of education: Not on file   Highest education level:  Not on file  Occupational History   Not on file  Tobacco Use   Smoking status: Never   Smokeless tobacco: Never  Vaping Use   Vaping status: Never Used  Substance and Sexual Activity   Alcohol use: No   Drug use: No   Sexual activity: Not Currently    Birth control/protection: Surgical  Other Topics Concern   Not on file  Social History Narrative   Not on file   Social Determinants of Health   Financial Resource Strain: Not on file  Food Insecurity: Low Risk  (03/08/2023)   Received from [facility]   Food vital sign    Within the past 12 months, you worried that your food would run out before you got money to buy more: Never true    Within the past 12 months, the food  you bought just didn't last and you didn't have money to get more. : Never true  Transportation Needs: Not on file (03/08/2023)  Physical Activity: Not on file  Stress: Not on file  Social Connections: Not on file     Family History: The patient's family history includes Diabetes in an other family member; Heart failure in an other family member; Hypertension in an other family member. ROS:   Please see the history of present illness.    All 14 point review of systems negative except as described per history of present illness.  EKGs/Labs/Other Studies Reviewed:    The following studies were reviewed today:   EKG:  EKG Interpretation Date/Time:  Tuesday April 05 2023 15:52:13 EDT Ventricular Rate:  71 PR Interval:  152 QRS Duration:  70 QT Interval:  364 QTC Calculation: 395 R Axis:   75  Text Interpretation: Normal sinus rhythm with sinus arrhythmia Nonspecific ST abnormality When compared with ECG of 30-Mar-2023 05:20, PREVIOUS ECG IS PRESENT Confirmed by Gypsy Balsam [phone]) on 04/05/2023 3:57:49 PM    Recent Labs: 03/30/2023: BUN 18; Creatinine, Ser 0.82; Hemoglobin 12.2; Platelets 287; Potassium 3.9; Sodium 137  Recent Lipid Panel No results found for: "CHOL", "TRIG", "HDL", "CHOLHDL", "VLDL", "LDLCALC", "LDLDIRECT"  Physical Exam:    VS:  BP (!) 132/90 (BP Location: Left Arm, Patient Position: Sitting)   Pulse 68   Ht 5\' 2"  (1.575 m)   Wt 149 lb (67.6 kg)   SpO2 98%   BMI 27.25 kg/m     Wt Readings from Last 3 Encounters:  04/05/23 149 lb (67.6 kg)  03/30/23 141 lb (64 kg)  05/30/21 145 lb (65.8 kg)     GEN:  Well nourished, well developed in no acute distress HEENT: Normal NECK: No JVD; No carotid bruits LYMPHATICS: No lymphadenopathy CARDIAC: RRR, no murmurs, no rubs, no gallops RESPIRATORY:  Clear to auscultation without rales, wheezing or rhonchi  ABDOMEN: Soft, non-tender, non-distended MUSCULOSKELETAL:  No edema; No deformity  SKIN: Warm and  dry NEUROLOGIC:  Alert and oriented x 3 PSYCHIATRIC:  Normal affect   ASSESSMENT:    1. Chest pain, unspecified type   2. Atypical chest pain   3. Dyslipidemia   4. Elevated blood pressure reading in office without diagnosis of hypertension    PLAN:    In order of problems listed above:  Chest pain atypical characteristic but she is so much worried about it to the point that she is afraid to exercise on the regular basis there are some concerning characteristics I think we must rule out coronary artery disease even though I have lower suspicion that this  is what the problem is.  I will schedule her to have coronary CT angio to rule it out.  That will also allow this to look at her esophagus and see the structures in her chest.  Her EKG does have some abnormality which are nonspecific with diffuse ST segment changes not diagnostic though I will schedule her to have sed rate as well as C-reactive protein make sure were not dealing with pericarditis. Dyslipidemia her HDL is elevated 89 which is a good number LDL at 117.  Will wait for coronary scan to decide if we need to treated   Medication Adjustments/Labs and Tests Ordered: Current medicines are reviewed at length with the patient today.  Concerns regarding medicines are outlined above.  Orders Placed This Encounter  Procedures   EKG 12-Lead   No orders of the defined types were placed in this encounter.   Signed, Georgeanna Lea, MD, [facility]. 04/05/2023 4:20 PM     [facility]

## 2023-04-11 ENCOUNTER — Telehealth (HOSPITAL_COMMUNITY): Payer: Self-pay | Admitting: *Deleted

## 2023-04-11 NOTE — Telephone Encounter (Signed)
Attempted to call patient regarding upcoming cardiac CT appointment. Left message on voicemail with name and callback number Hayley Sharpe RN Navigator Cardiac Imaging [facility] [phone] Office   

## 2023-04-12 ENCOUNTER — Ambulatory Visit (HOSPITAL_COMMUNITY): Payer: No Typology Code available for payment source

## 2023-04-14 ENCOUNTER — Telehealth (HOSPITAL_COMMUNITY): Payer: Self-pay | Admitting: *Deleted

## 2023-04-14 NOTE — Telephone Encounter (Signed)
Reaching out to patient to offer assistance regarding upcoming cardiac imaging study; pt verbalizes understanding of appt date/time, parking situation and where to check in, pre-test NPO status and medications ordered, and verified current allergies; name and call back number provided for further questions should they arise Hayley Sharpe RN Navigator Cardiac Imaging [facility] [phone] office [phone] cell  

## 2023-04-15 ENCOUNTER — Ambulatory Visit (HOSPITAL_COMMUNITY)
Admission: RE | Admit: 2023-04-15 | Discharge: 2023-04-15 | Disposition: A | Discharge status: Home / Self Care | Payer: No Typology Code available for payment source | Source: Ambulatory Visit | Attending: Cardiology | Admitting: Cardiology

## 2023-04-15 DIAGNOSIS — R072 Precordial pain: Secondary | ICD-10-CM | POA: Diagnosis not present

## 2023-04-15 MED ORDER — NITROGLYCERIN 0.4 MG SL SUBL
0.8000 mg | SUBLINGUAL_TABLET | Freq: Once | SUBLINGUAL | Status: AC
  Administered 2023-04-15: 0.8 mg via SUBLINGUAL

## 2023-04-15 MED ORDER — IOHEXOL 350 MG/ML SOLN
100.0000 mL | Freq: Once | INTRAVENOUS | Status: AC | PRN
  Administered 2023-04-15: 100 mL via INTRAVENOUS

## 2023-04-15 MED ORDER — NITROGLYCERIN 0.4 MG SL SUBL
SUBLINGUAL_TABLET | SUBLINGUAL | Status: AC
  Filled 2023-04-15: qty 2

## 2023-04-18 ENCOUNTER — Telehealth: Payer: Self-pay

## 2023-04-18 NOTE — Telephone Encounter (Signed)
Left message on My Chart with normal results per Dr. Krasowski's note. Routed to PCP. 

## 2023-04-21 ENCOUNTER — Telehealth: Payer: Self-pay

## 2023-04-21 NOTE — Telephone Encounter (Signed)
Pt viewed results in My Chart per Dr. Krasowski's note. Routed to PCP.  

## 2023-07-14 ENCOUNTER — Ambulatory Visit: Payer: No Typology Code available for payment source | Admitting: Cardiology

## 2024-01-17 ENCOUNTER — Ambulatory Visit: Payer: Self-pay

## 2024-01-17 ENCOUNTER — Emergency Department (HOSPITAL_BASED_OUTPATIENT_CLINIC_OR_DEPARTMENT_OTHER)

## 2024-01-17 ENCOUNTER — Other Ambulatory Visit: Payer: Self-pay

## 2024-01-17 ENCOUNTER — Encounter (HOSPITAL_BASED_OUTPATIENT_CLINIC_OR_DEPARTMENT_OTHER): Payer: Self-pay

## 2024-01-17 ENCOUNTER — Emergency Department (HOSPITAL_BASED_OUTPATIENT_CLINIC_OR_DEPARTMENT_OTHER)
Admission: EM | Admit: 2024-01-17 | Discharge: 2024-01-17 | Disposition: A | Discharge status: Home / Self Care | Attending: Emergency Medicine | Admitting: Emergency Medicine

## 2024-01-17 DIAGNOSIS — M940 Chondrocostal junction syndrome [Tietze]: Secondary | ICD-10-CM | POA: Diagnosis not present

## 2024-01-17 DIAGNOSIS — R0789 Other chest pain: Secondary | ICD-10-CM | POA: Diagnosis present

## 2024-01-17 LAB — BASIC METABOLIC PANEL WITH GFR
Anion gap: 10 (ref 5–15)
BUN: 19 mg/dL (ref 6–20)
CO2: 27 mmol/L (ref 22–32)
Calcium: 9 mg/dL (ref 8.9–10.3)
Chloride: 103 mmol/L (ref 98–111)
Creatinine, Ser: 0.89 mg/dL (ref 0.44–1.00)
GFR, Estimated: >60 mL/min (ref >60)
Glucose, Bld: 85 mg/dL (ref 70–99)
Potassium: 4 mmol/L (ref 3.5–5.1)
Sodium: 140 mmol/L (ref 135–145)

## 2024-01-17 LAB — CBC
HCT: 38.3 % (ref 36.0–46.0)
Hemoglobin: 12.8 g/dL (ref 12.0–15.0)
MCH: 30.1 pg (ref 26.0–34.0)
MCHC: 33.4 g/dL (ref 30.0–36.0)
MCV: 90.1 fL (ref 80.0–100.0)
Platelets: 277 10*3/uL (ref 150–400)
RBC: 4.25 MIL/uL (ref 3.87–5.11)
RDW: 13.8 % (ref 11.5–15.5)
WBC: 4.4 10*3/uL (ref 4.0–10.5)
nRBC: 0 % (ref 0.0–0.2)

## 2024-01-17 LAB — TROPONIN T, HIGH SENSITIVITY: Troponin T High Sensitivity: <15 ng/L (ref <19)

## 2024-01-17 MED ORDER — KETOROLAC TROMETHAMINE 15 MG/ML IJ SOLN
15.0000 mg | Freq: Once | INTRAMUSCULAR | Status: AC
  Administered 2024-01-17: 15 mg via INTRAVENOUS
  Filled 2024-01-17: qty 1

## 2024-01-17 MED ORDER — LIDOCAINE 5 % EX PTCH
1.0000 | MEDICATED_PATCH | CUTANEOUS | Status: DC
  Administered 2024-01-17: 1 via TRANSDERMAL
  Filled 2024-01-17: qty 1

## 2024-01-17 NOTE — ED Provider Notes (Signed)
 [place] [facility] Provider Note   CSN: [id number] Arrival date & time: 01/17/24  1701     History  Chief Complaint  Patient presents with   Chest Pain    Caitlyn Sosa is a 58 y.o. female.   Chest Pain    58 year old female with medical history significant for diverticulitis presenting to the emergency department with chest pain.  The patient states that has been ongoing since Sunday.  She states that she has had chest wall pain along the right side of her chest, described as sharp, worse with movement, twisting and bending.  No cough or shortness of breath.  No radiation to the back.  No abdominal pain, nausea, vomiting, diaphoresis.  No worsening of the pain and discomfort when lying flat, does not improve when sitting up and leaning forward.  No fevers or chills.  Pain is positional and reproducible to palpation.  She denies any rash along the chest wall.  Home Medications Prior to Admission medications   Not on File      Allergies    Other    Review of Systems   Review of Systems  Cardiovascular:  Positive for chest pain.  All other systems reviewed and are negative.   Physical Exam Updated Vital Signs BP (!) 145/100   Pulse 66   Temp 98.1 F (36.7 C)   Resp 14   SpO2 97%  Physical Exam Vitals and nursing note reviewed. Exam conducted with a chaperone present.  Constitutional:      General: She is not in acute distress. HENT:     Head: Normocephalic and atraumatic.  Eyes:     Conjunctiva/sclera: Conjunctivae normal.     Pupils: Pupils are equal, round, and reactive to light.  Cardiovascular:     Rate and Rhythm: Normal rate and regular rhythm.  Pulmonary:     Effort: Pulmonary effort is normal. No respiratory distress.  Chest:     Comments: Reproducible right-sided chest wall tenderness to palpation Abdominal:     General: There is no distension.     Tenderness: There is no guarding.  Musculoskeletal:         General: No deformity or signs of injury.     Cervical back: Neck supple.  Skin:    Findings: No lesion or rash.  Neurological:     General: No focal deficit present.     Mental Status: She is alert. Mental status is at baseline.     ED Results / Procedures / Treatments   Labs (all labs ordered are listed, but only abnormal results are displayed) Labs Reviewed  CBC  BASIC METABOLIC PANEL WITH GFR  TROPONIN T, HIGH SENSITIVITY    EKG EKG Interpretation Date/Time:  Tuesday Jan 17 2024 17:17:35 EDT Ventricular Rate:  72 PR Interval:  158 QRS Duration:  91 QT Interval:  383 QTC Calculation: 420 R Axis:   64  Text Interpretation: Sinus rhythm Abnormal R-wave progression, early transition Borderline ST elevation, anterior leads No significant change since last tracing Confirmed by Rosealee Concha ([id number]) on 01/17/2024 5:34:35 PM  Radiology DG Chest 2 View Result Date: 01/17/2024 CLINICAL DATA:  Chest pain. EXAM: CHEST - 2 VIEW COMPARISON:  March 30, 2023. FINDINGS: The heart size and mediastinal contours are within normal limits. Both lungs are clear. The visualized skeletal structures are unremarkable. IMPRESSION: No active cardiopulmonary disease. Electronically Signed   By: Rosalene Colon M.D.   On: 01/17/2024 17:58  Procedures Procedures    Medications Ordered in ED Medications  lidocaine (LIDODERM) 5 % 1 patch (1 patch Transdermal Patch Applied 01/17/24 1740)  ketorolac  (TORADOL ) 15 MG/ML injection 15 mg (15 mg Intravenous Given 01/17/24 1744)    ED Course/ Medical Decision Making/ A&P                                 Medical Decision Making Amount and/or Complexity of Data Reviewed Labs: ordered. Radiology: ordered.  Risk Prescription drug management.    58 year old female with medical history significant for diverticulitis presenting to the emergency department with chest pain.  The patient states that has been ongoing since Sunday.  She states that she has had  chest wall pain along the right side of her chest, described as sharp, worse with movement, twisting and bending.  No cough or shortness of breath.  No radiation to the back.  No abdominal pain, nausea, vomiting, diaphoresis.  No worsening of the pain and discomfort when lying flat, does not improve when sitting up and leaning forward.  No fevers or chills.  Pain is positional and reproducible to palpation.  She denies any rash along the chest wall.    Medical Decision Making: Caitlyn Sosa is a 58 y.o. female who presented to the ED today with chest pain, detailed above.  Based on patient's comorbidities, patient has a heart score of 3.     Complete initial physical exam performed, notably the patient was CTAB, had reproducible chest wall TTP along the sternum on the right.   Reviewed and confirmed nursing documentation for past medical history, family history, social history.    Initial Assessment: With the patient's presentation of left-sided chest pain, most likely diagnosis is musculoskeletal chest pain versus GERD, although ACS remains on the differential. Other diagnoses were considered including (but not limited to) pulmonary embolism, community-acquired pneumonia, aortic dissection, pneumothorax, underlying bony abnormality, anemia. These are considered less likely due to history of present illness and physical exam findings.      Initial Plan: Evaluate for ACS with single troponin and EKG evaluated as below  Evaluate for dissection, bony abnormality, or pneumonia with chest x-ray and screening laboratory evaluation including CBC, BMP  Further evaluation for Thoracic Aortic Dissection not indicated at this time based on patient's clinical history and PE findings.   Initial Study Results: EKG was reviewed independently. Rate, rhythm, axis, intervals all examined and without medically relevant abnormality. ST segments without concerns for elevations.    Laboratory  Single troponin  demonstrated normal values   CBC and BMP without obvious metabolic or inflammatory abnormalities requiring further evaluation   Radiology  DG Chest 2 View Result Date: 01/17/2024 CLINICAL DATA:  Chest pain. EXAM: CHEST - 2 VIEW COMPARISON:  March 30, 2023. FINDINGS: The heart size and mediastinal contours are within normal limits. Both lungs are clear. The visualized skeletal structures are unremarkable. IMPRESSION: No active cardiopulmonary disease. Electronically Signed   By: Rosalene Colon M.D.   On: 01/17/2024 17:58    Final Assessment and Plan: Workup overall reassuring.  Single troponin normal, per symptoms been ongoing for the past few days, do not think repeat troponin is indicated.  Symptoms not consistent with pericarditis.  Low concern for ACS.  Low concern for PE, no cough, shortness of breath, vital stable.  No evidence of shingles.  Patient symptoms consistent with likely costochondritis with reproducible chest wall tenderness to palpation  along the right side of the sternum.  Improved with NSAIDs and lidocaine patch.  Recommended continued treatment with the same outpatient, follow-up with her PCP, return precautions provided.   Final Clinical Impression(s) / ED Diagnoses Final diagnoses:  Chest wall pain  Costochondritis    Rx / DC Orders ED Discharge Orders     None         Rosealee Concha, MD 01/17/24 2003

## 2024-01-17 NOTE — Telephone Encounter (Signed)
  Chief Complaint: chest pain Symptoms: pain Frequency: onset 01/15/24 Pertinent Negatives: Patient denies difficulty breathing Disposition: [x] ED /[] Urgent Care (no appt availability in office) / [] Appointment(In office/virtual)/ []  [facility]/ [] Home Care/ [] Refused Recommended Disposition /[] [facility]/ []  Follow-up with PCP Additional Notes:  Developed chest pain on Sunday and it is constant. Thought maybe she strained a muscle when working so she was monitoring and using Aleve, which minimizes pain, but calling now to schedule with PCP because this is pain she has never felt before from pulled muscle. Emergency evaluation advised, patient in agreement.     Copied from CRM [id number]. Topic: Clinical - Red Word Triage >> Jan 17, 2024  4:17 PM [place] wrote: Red Word that prompted transfer to Nurse Triage: has had chest pain since Sunday, it radiates to the right Reason for Disposition  [1] Chest pain lasts > 5 minutes AND [2] age > 44  Protocols used: Chest Pain-A-AH

## 2024-01-17 NOTE — Discharge Instructions (Addendum)
 Your cardiac workup was overall reassuring.  Your symptoms are consistent with musculoskeletal chest pain and/or costochondritis.  Treatment for this is NSAIDs, follow-up with your PCP, can also try over-the-counter lidocaine patches.

## 2024-01-17 NOTE — ED Triage Notes (Signed)
 Pt is coming in for chest pain she has had since Saturday, it is a reproducible type pain that gets worse with movement such as vacuuming or bending down. States the pain starts in the center and goes to the right. No other associated symptoms at this times.

## 2024-03-14 ENCOUNTER — Ambulatory Visit: Admitting: Family Medicine

## 2024-03-14 ENCOUNTER — Encounter: Payer: Self-pay | Admitting: Family Medicine

## 2024-03-14 VITALS — BP 138/102 | HR 73 | Temp 98.1°F | Ht 62.0 in | Wt 154.1 lb

## 2024-03-14 DIAGNOSIS — R030 Elevated blood-pressure reading, without diagnosis of hypertension: Secondary | ICD-10-CM

## 2024-03-14 DIAGNOSIS — Z7689 Persons encountering health services in other specified circumstances: Secondary | ICD-10-CM

## 2024-03-14 DIAGNOSIS — E785 Hyperlipidemia, unspecified: Secondary | ICD-10-CM

## 2024-03-14 DIAGNOSIS — F419 Anxiety disorder, unspecified: Secondary | ICD-10-CM | POA: Diagnosis not present

## 2024-03-14 DIAGNOSIS — Z1211 Encounter for screening for malignant neoplasm of colon: Secondary | ICD-10-CM

## 2024-03-14 DIAGNOSIS — R0789 Other chest pain: Secondary | ICD-10-CM | POA: Diagnosis not present

## 2024-03-14 MED ORDER — SERTRALINE HCL 25 MG PO TABS: 25.0000 mg | ORAL_TABLET | Freq: Every day | ORAL | 1 refills | Status: AC

## 2024-03-14 NOTE — Progress Notes (Signed)
 Patient Office Visit  Assessment & Plan:  Encounter to establish care  Screen for colon cancer -     Amb Referral to Colonoscopy  Atypical chest pain  Anxiety -     Ambulatory referral to Psychology -     TSH  Dyslipidemia -     Lipid panel -     TSH  Elevated blood pressure reading in office without diagnosis of hypertension -     CBC with Differential/Platelet -     Comprehensive metabolic panel with GFR  Other orders -     Sertraline  HCl; Take 1 tablet (25 mg total) by mouth daily.  Dispense: 90 tablet; Refill: 1   Assessment and Plan    Costochondritis/atypical chest pain Recurrent chest pain episodes resolved, likely related to physical activity and stress. Previous cardiology evaluation normal. - Encourage gradual return to physical activity, focusing on cardio exercises.  Depression and Anxiety Chronic stress and emotional burden. Discussed benefits of medication and therapy.  - Prescribe sertraline  25 mg daily, with potential to increase dosage based on response. - Refer to psychotherapy for counseling. - Follow up in 4-6 weeks to assess response to medication.  Hot Flashes Persistent hot flashes, exacerbated by sugar intake. Discussed potential treatment options, including estrogen therapy ie patch vs gabapentin - Monitor hot flash triggers and symptoms. - Consider estrogen therapy if symptoms become severe, ensuring up-to-date mammogram.  General Health Maintenance Due for routine health screenings and vaccinations. Previous colonoscopy in 2021 showed nine polyps. - Schedule mammogram. - Order colonoscopy. - Perform blood work to check basic health parameters.          No follow-ups on file.   Subjective:    Patient ID: Caitlyn Sosa, female    DOB: 05/28/1966  Age: 58 y.o. MRN: [id number]  Chief Complaint  Patient presents with   Medical Management of Chronic Issues   Establish Care    HPI Discussed the use of AI scribe software for  clinical note transcription with the patient, who gave verbal consent to proceed.  History of Present Illness        Caitlyn Sosa is a 58 year old female who presents with stress management issues and a history of chest pain. also here to establish primary care.   She initially experienced chest pain in May of last year, which led to a diagnosis of costochondritis. In August, she had a recurrence of similar chest pain and underwent a stress test and coronary CT angiogram, both of which were normal with a calcium score of zero. The chest pain has since resolved, and she attributes this to avoiding weight lifting. She is now attempting to resume her daily cardio exercises.  She is heavily involved in the care of her grandchildren (3) managing their daily activities and household chores. This responsibility has impacted her ability to focus on her own health, leading to feelings of being overwhelmed and neglecting her personal needs. She reports difficulty managing stress, weight gain, and trouble focusing. patient has been struggling with her mood/anxiety and still grieving over the loss of her daughter 3 years ago.   She has a history of diverticulitis, which led to a colonoscopy in 2021 where nine polyps were removed. She underwent a hysterectomy in 2013 but retained her ovaries. She experiences hot flashes, which worsen with sugar intake, and episodes of urgent thirst.  No current chest pain. Reports hot flashes and episodes of urgent thirst. Physical Exam Results RADIOLOGY Coronary CT Angiography: Normal Calcium  Score: 0  DIAGNOSTIC Colonoscopy: 9 polyps removed (2021) Assessment & Plan Costochondritis/atypical chest pain Recurrent chest pain episodes resolved, likely related to physical activity and stress. Previous cardiology evaluation normal. - Encourage gradual return to physical activity, focusing on cardio exercises.  Depression and Anxiety Chronic stress and emotional burden.  Discussed benefits of medication and therapy.  - Prescribe sertraline  25 mg daily, with potential to increase dosage based on response. - Refer to psychotherapy for counseling. - Follow up in 4-6 weeks to assess response to medication.  Hot Flashes Persistent hot flashes, exacerbated by sugar intake. Discussed potential treatment options, including estrogen therapy ie patch vs gabapentin - Monitor hot flash triggers and symptoms. - Consider estrogen therapy if symptoms become severe, ensuring up-to-date mammogram.  General Health Maintenance Due for routine health screenings and vaccinations. Previous colonoscopy in 2021 showed nine polyps. - Schedule mammogram. - Order colonoscopy. - Perform blood work to check basic health parameters. The ASCVD Risk score (Arnett DK, et al., 2019) failed to calculate for the following reasons:   The valid HDL cholesterol range is 20 to 100 mg/dL    The 89-bzjm ASCVD risk score (Arnett DK, et al., 2019) is: 2.3%  Past Medical History:  Diagnosis Date   Diverticulitis    Hair loss    Hot flashes    Past Surgical History:  Procedure Laterality Date   ABDOMINAL HYSTERECTOMY     HERNIA REPAIR     Social History   Tobacco Use   Smoking status: Never   Smokeless tobacco: Never  Vaping Use   Vaping status: Never Used  Substance Use Topics   Alcohol use: No   Drug use: No   Family History  Problem Relation Age of Onset   Hypertension Other    Heart failure Other    Diabetes Other    Allergies  Allergen Reactions   Other Swelling    Hair Dye- Scalp swelling  Hair Dye causes Scalp & Facial Swelling    ROS    Objective:    BP (!) 138/102   Pulse 73   Temp 98.1 F (36.7 C)   Ht 5' 2 (1.575 m)   Wt 154 lb 2 oz (69.9 kg)   SpO2 98%   BMI 28.19 kg/m  BP Readings from Last 3 Encounters:  03/14/24 (!) 138/102  01/17/24 (!) 145/100  04/15/23 122/84   Wt Readings from Last 3 Encounters:  03/14/24 154 lb 2 oz (69.9 kg)   04/05/23 149 lb (67.6 kg)  03/30/23 141 lb (64 kg)    Physical Exam Vitals and nursing note reviewed.  Constitutional:      Appearance: Normal appearance.  HENT:     Head: Normocephalic.     Right Ear: Tympanic membrane, ear canal and external ear normal.     Left Ear: Tympanic membrane, ear canal and external ear normal.  Eyes:     Extraocular Movements: Extraocular movements intact.     Conjunctiva/sclera: Conjunctivae normal.     Pupils: Pupils are equal, round, and reactive to light.  Cardiovascular:     Rate and Rhythm: Normal rate and regular rhythm.     Heart sounds: Normal heart sounds.  Pulmonary:     Effort: Pulmonary effort is normal.     Breath sounds: Normal breath sounds.  Musculoskeletal:     Right lower leg: No edema.     Left lower leg: No edema.  Neurological:     General: No focal deficit present.  Mental Status: She is alert and oriented to person, place, and time.  Psychiatric:        Attention and Perception: Attention normal.        Mood and Affect: Mood normal. Affect is tearful.        Speech: Speech normal.        Behavior: Behavior normal.        Cognition and Memory: Cognition normal.      No results found for any visits on 03/14/24.

## 2024-03-15 LAB — COMPREHENSIVE METABOLIC PANEL WITH GFR
AG Ratio: 1.1 (calc) (ref 1.0–2.5)
ALT: 22 U/L (ref 6–29)
AST: 27 U/L (ref 10–35)
Albumin: 4.3 g/dL (ref 3.6–5.1)
Alkaline phosphatase (APISO): 114 U/L (ref 37–153)
BUN: 17 mg/dL (ref 7–25)
CO2: 27 mmol/L (ref 20–32)
Calcium: 9.7 mg/dL (ref 8.6–10.4)
Chloride: 103 mmol/L (ref 98–110)
Creat: 0.86 mg/dL (ref 0.50–1.03)
Globulin: 3.9 g/dL — ABNORMAL HIGH (ref 1.9–3.7)
Glucose, Bld: 85 mg/dL (ref 65–99)
Potassium: 4.5 mmol/L (ref 3.5–5.3)
Sodium: 140 mmol/L (ref 135–146)
Total Bilirubin: 0.4 mg/dL (ref 0.2–1.2)
Total Protein: 8.2 g/dL — ABNORMAL HIGH (ref 6.1–8.1)
eGFR: 78 mL/min/1.73m2 (ref >=60)

## 2024-03-15 LAB — CBC WITH DIFFERENTIAL/PLATELET
Absolute Lymphocytes: 1733 {cells}/uL (ref 850–3900)
Absolute Monocytes: 279 {cells}/uL (ref 200–950)
Basophils Absolute: 41 {cells}/uL (ref 0–200)
Basophils Relative: 0.9 %
Eosinophils Absolute: 50 {cells}/uL (ref 15–500)
Eosinophils Relative: 1.1 %
HCT: 39.9 % (ref 35.0–45.0)
Hemoglobin: 12.9 g/dL (ref 11.7–15.5)
MCH: 30 pg (ref 27.0–33.0)
MCHC: 32.3 g/dL (ref 32.0–36.0)
MCV: 92.8 fL (ref 80.0–100.0)
MPV: 10.4 fL (ref 7.5–12.5)
Monocytes Relative: 6.2 %
Neutro Abs: 2399 {cells}/uL (ref 1500–7800)
Neutrophils Relative %: 53.3 %
Platelets: 297 Thousand/uL (ref 140–400)
RBC: 4.3 Million/uL (ref 3.80–5.10)
RDW: 13.5 % (ref 11.0–15.0)
Total Lymphocyte: 38.5 %
WBC: 4.5 Thousand/uL (ref 3.8–10.8)

## 2024-03-15 LAB — TSH: TSH: 0.65 m[IU]/L (ref 0.40–4.50)

## 2024-03-15 LAB — LIPID PANEL
Cholesterol: 260 mg/dL — ABNORMAL HIGH (ref <200)
HDL: 119 mg/dL (ref >=50)
LDL Cholesterol (Calc): 126 mg/dL — ABNORMAL HIGH
Non-HDL Cholesterol (Calc): 141 mg/dL — ABNORMAL HIGH (ref <130)
Total CHOL/HDL Ratio: 2.2 (calc) (ref <5.0)
Triglycerides: 61 mg/dL (ref <150)

## 2024-03-16 ENCOUNTER — Ambulatory Visit: Payer: Self-pay | Admitting: Family Medicine

## 2024-04-24 ENCOUNTER — Ambulatory Visit: Admitting: Family Medicine

## 2024-05-03 LAB — HM MAMMOGRAPHY

## 2024-05-15 ENCOUNTER — Ambulatory Visit: Admitting: Family Medicine

## 2024-05-16 ENCOUNTER — Encounter: Payer: Self-pay | Admitting: Family Medicine

## 2024-06-25 LAB — HM COLONOSCOPY
